# Patient Record
Sex: Male | Born: 1935 | Race: White | Hispanic: No | Smoking: Never smoker
Health system: Southern US, Community
[De-identification: ages and names within clinical notes are randomized; demographics above are authoritative.]

## PROBLEM LIST (undated history)

## (undated) DIAGNOSIS — I639 Cerebral infarction, unspecified: Secondary | ICD-10-CM

## (undated) DIAGNOSIS — I82409 Acute embolism and thrombosis of unspecified deep veins of unspecified lower extremity: Secondary | ICD-10-CM

## (undated) DIAGNOSIS — R410 Disorientation, unspecified: Secondary | ICD-10-CM

## (undated) DIAGNOSIS — I509 Heart failure, unspecified: Secondary | ICD-10-CM

## (undated) DIAGNOSIS — I10 Essential (primary) hypertension: Secondary | ICD-10-CM

## (undated) DIAGNOSIS — R339 Retention of urine, unspecified: Secondary | ICD-10-CM

## (undated) DIAGNOSIS — N400 Enlarged prostate without lower urinary tract symptoms: Secondary | ICD-10-CM

## (undated) DIAGNOSIS — R451 Restlessness and agitation: Secondary | ICD-10-CM

## (undated) HISTORY — PX: FOOT FRACTURE SURGERY: SHX645

## (undated) HISTORY — PX: OTHER SURGICAL HISTORY: SHX169

---

## 1997-06-24 ENCOUNTER — Other Ambulatory Visit: Admission: RE | Admit: 1997-06-24 | Discharge: 1997-06-24 | Payer: Self-pay | Admitting: Gastroenterology

## 1997-07-16 ENCOUNTER — Other Ambulatory Visit: Admission: RE | Admit: 1997-07-16 | Discharge: 1997-07-16 | Payer: Self-pay | Admitting: Gastroenterology

## 2003-01-11 ENCOUNTER — Emergency Department (HOSPITAL_COMMUNITY): Admission: EM | Admit: 2003-01-11 | Discharge: 2003-01-11 | Payer: Self-pay | Admitting: Internal Medicine

## 2003-06-27 ENCOUNTER — Ambulatory Visit (HOSPITAL_COMMUNITY): Admission: RE | Admit: 2003-06-27 | Discharge: 2003-06-27 | Payer: Self-pay | Admitting: Family Medicine

## 2003-06-30 ENCOUNTER — Emergency Department (HOSPITAL_COMMUNITY): Admission: EM | Admit: 2003-06-30 | Discharge: 2003-06-30 | Payer: Self-pay | Admitting: Emergency Medicine

## 2004-03-30 ENCOUNTER — Ambulatory Visit (HOSPITAL_COMMUNITY): Admission: RE | Admit: 2004-03-30 | Discharge: 2004-03-30 | Payer: Self-pay | Admitting: Family Medicine

## 2004-10-05 ENCOUNTER — Ambulatory Visit (HOSPITAL_COMMUNITY): Admission: RE | Admit: 2004-10-05 | Discharge: 2004-10-05 | Payer: Self-pay | Admitting: Family Medicine

## 2004-12-16 ENCOUNTER — Ambulatory Visit (HOSPITAL_COMMUNITY): Admission: RE | Admit: 2004-12-16 | Discharge: 2004-12-16 | Payer: Self-pay | Admitting: *Deleted

## 2004-12-22 ENCOUNTER — Ambulatory Visit (HOSPITAL_COMMUNITY): Admission: RE | Admit: 2004-12-22 | Discharge: 2004-12-22 | Payer: Self-pay | Admitting: Cardiology

## 2005-08-21 ENCOUNTER — Emergency Department (HOSPITAL_COMMUNITY): Admission: EM | Admit: 2005-08-21 | Discharge: 2005-08-21 | Payer: Self-pay | Admitting: Emergency Medicine

## 2006-08-21 ENCOUNTER — Ambulatory Visit (HOSPITAL_COMMUNITY): Admission: RE | Admit: 2006-08-21 | Discharge: 2006-08-21 | Payer: Self-pay | Admitting: Family Medicine

## 2009-04-20 ENCOUNTER — Ambulatory Visit (HOSPITAL_COMMUNITY): Admission: RE | Admit: 2009-04-20 | Discharge: 2009-04-20 | Payer: Self-pay | Admitting: Family Medicine

## 2009-07-31 ENCOUNTER — Encounter: Payer: Self-pay | Admitting: Emergency Medicine

## 2009-07-31 ENCOUNTER — Inpatient Hospital Stay (HOSPITAL_COMMUNITY): Admission: EM | Admit: 2009-07-31 | Discharge: 2009-08-05 | Payer: Self-pay | Admitting: Emergency Medicine

## 2009-08-04 ENCOUNTER — Encounter (INDEPENDENT_AMBULATORY_CARE_PROVIDER_SITE_OTHER): Payer: Self-pay | Admitting: Cardiovascular Disease

## 2009-08-05 ENCOUNTER — Inpatient Hospital Stay: Admission: AD | Admit: 2009-08-05 | Discharge: 2009-08-19 | Payer: Self-pay | Admitting: Internal Medicine

## 2009-08-19 ENCOUNTER — Ambulatory Visit: Payer: Self-pay | Admitting: Cardiology

## 2009-08-19 ENCOUNTER — Inpatient Hospital Stay (HOSPITAL_COMMUNITY): Admission: RE | Admit: 2009-08-19 | Discharge: 2009-09-04 | Payer: Self-pay | Admitting: Orthopedic Surgery

## 2009-09-04 ENCOUNTER — Inpatient Hospital Stay: Admission: AD | Admit: 2009-09-04 | Discharge: 2009-09-18 | Payer: Self-pay | Admitting: Internal Medicine

## 2009-09-18 ENCOUNTER — Inpatient Hospital Stay (HOSPITAL_COMMUNITY): Admission: EM | Admit: 2009-09-18 | Discharge: 2009-09-24 | Payer: Self-pay | Admitting: Emergency Medicine

## 2009-09-18 ENCOUNTER — Ambulatory Visit: Payer: Self-pay | Admitting: Cardiology

## 2009-09-24 ENCOUNTER — Inpatient Hospital Stay
Admission: AD | Admit: 2009-09-24 | Discharge: 2011-06-16 | Disposition: A | Discharge status: Other Acute Inpatient Hospital | Payer: Medicare Other | Source: Home / Self Care | Attending: Internal Medicine | Admitting: Internal Medicine

## 2009-11-28 ENCOUNTER — Ambulatory Visit (HOSPITAL_COMMUNITY): Admission: RE | Admit: 2009-11-28 | Discharge: 2009-11-28 | Payer: Self-pay | Admitting: Internal Medicine

## 2010-04-04 ENCOUNTER — Encounter: Payer: Self-pay | Admitting: Family Medicine

## 2010-04-04 ENCOUNTER — Encounter: Payer: Self-pay | Admitting: Orthopedic Surgery

## 2010-04-22 ENCOUNTER — Ambulatory Visit (INDEPENDENT_AMBULATORY_CARE_PROVIDER_SITE_OTHER): Payer: Medicare Other | Admitting: Otolaryngology

## 2010-04-22 DIAGNOSIS — J342 Deviated nasal septum: Secondary | ICD-10-CM

## 2010-04-22 DIAGNOSIS — R070 Pain in throat: Secondary | ICD-10-CM

## 2010-04-22 DIAGNOSIS — J310 Chronic rhinitis: Secondary | ICD-10-CM

## 2010-05-30 LAB — BASIC METABOLIC PANEL
BUN: 11 mg/dL (ref 6–23)
BUN: 12 mg/dL (ref 6–23)
BUN: 21 mg/dL (ref 6–23)
BUN: 10 mg/dL (ref 6–23)
BUN: 11 mg/dL (ref 6–23)
BUN: 12 mg/dL (ref 6–23)
BUN: 13 mg/dL (ref 6–23)
CO2: 25 mEq/L (ref 19–32)
CO2: 25 mEq/L (ref 19–32)
CO2: 28 mEq/L (ref 19–32)
CO2: 28 mEq/L (ref 19–32)
Calcium: 8.2 mg/dL — ABNORMAL LOW (ref 8.4–10.5)
Calcium: 8.4 mg/dL (ref 8.4–10.5)
Calcium: 8.4 mg/dL (ref 8.4–10.5)
Calcium: 8.2 mg/dL — ABNORMAL LOW (ref 8.4–10.5)
Chloride: 106 mEq/L (ref 96–112)
Chloride: 108 mEq/L (ref 96–112)
Chloride: 102 mEq/L (ref 96–112)
Chloride: 104 mEq/L (ref 96–112)
Chloride: 105 mEq/L (ref 96–112)
Chloride: 105 mEq/L (ref 96–112)
Creatinine, Ser: 1.03 mg/dL (ref 0.4–1.5)
Creatinine, Ser: 1.03 mg/dL (ref 0.4–1.5)
Creatinine, Ser: 1.15 mg/dL (ref 0.4–1.5)
Creatinine, Ser: 1.37 mg/dL (ref 0.4–1.5)
Creatinine, Ser: 1.05 mg/dL (ref 0.4–1.5)
Creatinine, Ser: 1.08 mg/dL (ref 0.4–1.5)
Creatinine, Ser: 1.14 mg/dL (ref 0.4–1.5)
Creatinine, Ser: 1.19 mg/dL (ref 0.4–1.5)
GFR calc Af Amer: >60 mL/min (ref >60)
GFR calc Af Amer: >60 mL/min (ref >60)
GFR calc Af Amer: >60 mL/min (ref >60)
GFR calc Af Amer: >60 mL/min (ref >60)
GFR calc Af Amer: >60 mL/min (ref >60)
GFR calc non Af Amer: 49 mL/min — ABNORMAL LOW (ref >60)
GFR calc non Af Amer: >60 mL/min (ref >60)
GFR calc non Af Amer: >60 mL/min (ref >60)
GFR calc non Af Amer: >60 mL/min (ref >60)
Glucose, Bld: 106 mg/dL — ABNORMAL HIGH (ref 70–99)
Glucose, Bld: 93 mg/dL (ref 70–99)
Glucose, Bld: 94 mg/dL (ref 70–99)
Glucose, Bld: 103 mg/dL — ABNORMAL HIGH (ref 70–99)
Glucose, Bld: 103 mg/dL — ABNORMAL HIGH (ref 70–99)
Potassium: 4.5 mEq/L (ref 3.5–5.1)
Potassium: 3.9 mEq/L (ref 3.5–5.1)
Sodium: 137 mEq/L (ref 135–145)

## 2010-05-30 LAB — CARDIAC PANEL(CRET KIN+CKTOT+MB+TROPI)
CK, MB: 0.6 ng/mL (ref 0.3–4.0)
CK, MB: 0.6 ng/mL (ref 0.3–4.0)
CK, MB: 1.5 ng/mL (ref 0.3–4.0)
Relative Index: INVALID (ref 0.0–2.5)
Relative Index: INVALID (ref 0.0–2.5)
Total CK: 12 U/L (ref 7–232)
Total CK: 13 U/L (ref 7–232)
Troponin I: 0.02 ng/mL (ref 0.00–0.06)

## 2010-05-30 LAB — CBC
HCT: 36.2 % — ABNORMAL LOW (ref 39.0–52.0)
HCT: 37.5 % — ABNORMAL LOW (ref 39.0–52.0)
HCT: 33.2 % — ABNORMAL LOW (ref 39.0–52.0)
HCT: 33.3 % — ABNORMAL LOW (ref 39.0–52.0)
HCT: 37.8 % — ABNORMAL LOW (ref 39.0–52.0)
Hemoglobin: 11.9 g/dL — ABNORMAL LOW (ref 13.0–17.0)
MCH: 30.6 pg (ref 26.0–34.0)
MCH: 30.6 pg (ref 26.0–34.0)
MCH: 30.7 pg (ref 26.0–34.0)
MCHC: 33 g/dL (ref 30.0–36.0)
MCHC: 33 g/dL (ref 30.0–36.0)
MCHC: 33.2 g/dL (ref 30.0–36.0)
MCHC: 33.4 g/dL (ref 30.0–36.0)
MCHC: 33.5 g/dL (ref 30.0–36.0)
MCV: 92.8 fL (ref 78.0–100.0)
MCV: 92.8 fL (ref 78.0–100.0)
MCV: 93.4 fL (ref 78.0–100.0)
MCV: 93.4 fL (ref 78.0–100.0)
MCV: 94.4 fL (ref 78.0–100.0)
MCV: 94.7 fL (ref 78.0–100.0)
MCV: 95.5 fL (ref 78.0–100.0)
Platelets: 178 10*3/uL (ref 150–400)
Platelets: 188 10*3/uL (ref 150–400)
Platelets: 194 10*3/uL (ref 150–400)
Platelets: 201 10*3/uL (ref 150–400)
Platelets: 251 10*3/uL (ref 150–400)
Platelets: 285 10*3/uL (ref 150–400)
RBC: 3.87 MIL/uL — ABNORMAL LOW (ref 4.22–5.81)
RBC: 3.89 MIL/uL — ABNORMAL LOW (ref 4.22–5.81)
RBC: 4.04 MIL/uL — ABNORMAL LOW (ref 4.22–5.81)
RBC: 4.05 MIL/uL — ABNORMAL LOW (ref 4.22–5.81)
RBC: 3.49 MIL/uL — ABNORMAL LOW (ref 4.22–5.81)
RBC: 3.5 MIL/uL — ABNORMAL LOW (ref 4.22–5.81)
RBC: 3.71 MIL/uL — ABNORMAL LOW (ref 4.22–5.81)
RDW: 15.9 % — ABNORMAL HIGH (ref 11.5–15.5)
RDW: 16.5 % — ABNORMAL HIGH (ref 11.5–15.5)
RDW: 16.5 % — ABNORMAL HIGH (ref 11.5–15.5)
RDW: 16.5 % — ABNORMAL HIGH (ref 11.5–15.5)
WBC: 7.3 10*3/uL (ref 4.0–10.5)
WBC: 6.1 10*3/uL (ref 4.0–10.5)
WBC: 6.4 10*3/uL (ref 4.0–10.5)
WBC: 7.4 10*3/uL (ref 4.0–10.5)

## 2010-05-30 LAB — DIFFERENTIAL
Basophils Absolute: 0 10*3/uL (ref 0.0–0.1)
Basophils Absolute: 0 10*3/uL (ref 0.0–0.1)
Basophils Relative: 0 % (ref 0–1)
Basophils Relative: 0 % (ref 0–1)
Basophils Relative: 1 % (ref 0–1)
Eosinophils Absolute: 0.7 10*3/uL (ref 0.0–0.7)
Eosinophils Absolute: 0.7 10*3/uL (ref 0.0–0.7)
Eosinophils Absolute: 0.8 10*3/uL — ABNORMAL HIGH (ref 0.0–0.7)
Eosinophils Relative: 10 % — ABNORMAL HIGH (ref 0–5)
Eosinophils Relative: 7 % — ABNORMAL HIGH (ref 0–5)
Eosinophils Relative: 9 % — ABNORMAL HIGH (ref 0–5)
Lymphocytes Relative: 31 % (ref 12–46)
Lymphs Abs: 2.2 10*3/uL (ref 0.7–4.0)
Lymphs Abs: 2.2 10*3/uL (ref 0.7–4.0)
Lymphs Abs: 2.2 10*3/uL (ref 0.7–4.0)
Monocytes Absolute: 0.5 10*3/uL (ref 0.1–1.0)
Monocytes Absolute: 0.5 10*3/uL (ref 0.1–1.0)
Monocytes Relative: 7 % (ref 3–12)
Neutro Abs: 3.7 10*3/uL (ref 1.7–7.7)
Neutro Abs: 5.4 10*3/uL (ref 1.7–7.7)
Neutrophils Relative %: 53 % (ref 43–77)
Neutrophils Relative %: 64 % (ref 43–77)

## 2010-05-30 LAB — VANCOMYCIN, TROUGH: Vancomycin Tr: 13 ug/mL (ref 10.0–20.0)

## 2010-05-30 LAB — POCT CARDIAC MARKERS
CKMB, poc: <1 ng/mL — ABNORMAL LOW (ref 1.0–8.0)
Myoglobin, poc: 65 ng/mL (ref 12–200)

## 2010-05-30 LAB — URINALYSIS, ROUTINE W REFLEX MICROSCOPIC
Bilirubin Urine: NEGATIVE
Protein, ur: NEGATIVE mg/dL
Urobilinogen, UA: 0.2 mg/dL (ref 0.0–1.0)

## 2010-05-30 LAB — D-DIMER, QUANTITATIVE: D-Dimer, Quant: 3.77 ug/mL-FEU — ABNORMAL HIGH (ref 0.00–0.48)

## 2010-05-30 LAB — BRAIN NATRIURETIC PEPTIDE
Pro B Natriuretic peptide (BNP): 252 pg/mL — ABNORMAL HIGH (ref 0.0–100.0)
Pro B Natriuretic peptide (BNP): 293 pg/mL — ABNORMAL HIGH (ref 0.0–100.0)
Pro B Natriuretic peptide (BNP): 500 pg/mL — ABNORMAL HIGH (ref 0.0–100.0)
Pro B Natriuretic peptide (BNP): 505 pg/mL — ABNORMAL HIGH (ref 0.0–100.0)

## 2010-05-30 LAB — CORTISOL-AM, BLOOD: Cortisol - AM: 8.1 ug/dL (ref 4.3–22.4)

## 2010-05-31 LAB — DIFFERENTIAL
Basophils Absolute: 0 10*3/uL (ref 0.0–0.1)
Basophils Absolute: 0.1 10*3/uL (ref 0.0–0.1)
Basophils Absolute: 0 10*3/uL (ref 0.0–0.1)
Basophils Relative: 0 % (ref 0–1)
Basophils Relative: 0 % (ref 0–1)
Eosinophils Absolute: 0.1 10*3/uL (ref 0.0–0.7)
Eosinophils Absolute: 0 10*3/uL (ref 0.0–0.7)
Eosinophils Relative: 0 % (ref 0–5)
Eosinophils Relative: 1 % (ref 0–5)
Eosinophils Relative: 0 % (ref 0–5)
Lymphocytes Relative: 16 % (ref 12–46)
Lymphocytes Relative: 9 % — ABNORMAL LOW (ref 12–46)
Lymphocytes Relative: 29 % (ref 12–46)
Lymphs Abs: 1.4 10*3/uL (ref 0.7–4.0)
Monocytes Absolute: 0.5 10*3/uL (ref 0.1–1.0)
Monocytes Absolute: 0.7 10*3/uL (ref 0.1–1.0)
Monocytes Absolute: 0.4 10*3/uL (ref 0.1–1.0)
Neutro Abs: 5 10*3/uL (ref 1.7–7.7)

## 2010-05-31 LAB — BASIC METABOLIC PANEL
BUN: 14 mg/dL (ref 6–23)
BUN: 18 mg/dL (ref 6–23)
BUN: 18 mg/dL (ref 6–23)
BUN: 19 mg/dL (ref 6–23)
CO2: 22 mEq/L (ref 19–32)
CO2: 23 mEq/L (ref 19–32)
CO2: 23 mEq/L (ref 19–32)
CO2: 18 mEq/L — ABNORMAL LOW (ref 19–32)
CO2: 21 mEq/L (ref 19–32)
Calcium: 7.8 mg/dL — ABNORMAL LOW (ref 8.4–10.5)
Calcium: 8 mg/dL — ABNORMAL LOW (ref 8.4–10.5)
Chloride: 105 mEq/L (ref 96–112)
Chloride: 107 mEq/L (ref 96–112)
Chloride: 108 mEq/L (ref 96–112)
Chloride: 109 mEq/L (ref 96–112)
Chloride: 110 mEq/L (ref 96–112)
Chloride: 115 mEq/L — ABNORMAL HIGH (ref 96–112)
Creatinine, Ser: 1.1 mg/dL (ref 0.4–1.5)
Creatinine, Ser: 1.16 mg/dL (ref 0.4–1.5)
Creatinine, Ser: 1.21 mg/dL (ref 0.4–1.5)
Creatinine, Ser: 1.21 mg/dL (ref 0.4–1.5)
Creatinine, Ser: 1.1 mg/dL (ref 0.4–1.5)
Creatinine, Ser: 1.14 mg/dL (ref 0.4–1.5)
GFR calc Af Amer: >60 mL/min (ref >60)
GFR calc Af Amer: >60 mL/min (ref >60)
GFR calc Af Amer: >60 mL/min (ref >60)
GFR calc Af Amer: >60 mL/min (ref >60)
GFR calc Af Amer: >60 mL/min (ref >60)
GFR calc non Af Amer: 56 mL/min — ABNORMAL LOW (ref >60)
GFR calc non Af Amer: >60 mL/min (ref >60)
GFR calc non Af Amer: 50 mL/min — ABNORMAL LOW (ref >60)
GFR calc non Af Amer: 53 mL/min — ABNORMAL LOW (ref >60)
GFR calc non Af Amer: >60 mL/min (ref >60)
Glucose, Bld: 103 mg/dL — ABNORMAL HIGH (ref 70–99)
Glucose, Bld: 150 mg/dL — ABNORMAL HIGH (ref 70–99)
Glucose, Bld: 163 mg/dL — ABNORMAL HIGH (ref 70–99)
Potassium: 4.3 mEq/L (ref 3.5–5.1)
Potassium: 4.4 mEq/L (ref 3.5–5.1)
Potassium: 3.7 mEq/L (ref 3.5–5.1)
Potassium: 3.9 mEq/L (ref 3.5–5.1)
Potassium: 4.3 mEq/L (ref 3.5–5.1)
Potassium: 4.4 mEq/L (ref 3.5–5.1)
Sodium: 134 mEq/L — ABNORMAL LOW (ref 135–145)
Sodium: 137 mEq/L (ref 135–145)

## 2010-05-31 LAB — TYPE AND SCREEN: ABO/RH(D): O POS

## 2010-05-31 LAB — URINE CULTURE: Culture: NO GROWTH

## 2010-05-31 LAB — CBC
HCT: 26.8 % — ABNORMAL LOW (ref 39.0–52.0)
HCT: 28.2 % — ABNORMAL LOW (ref 39.0–52.0)
HCT: 31.5 % — ABNORMAL LOW (ref 39.0–52.0)
HCT: 30.3 % — ABNORMAL LOW (ref 39.0–52.0)
HCT: 27.1 % — ABNORMAL LOW (ref 39.0–52.0)
HCT: 28.9 % — ABNORMAL LOW (ref 39.0–52.0)
HCT: 32.5 % — ABNORMAL LOW (ref 39.0–52.0)
HCT: 35.7 % — ABNORMAL LOW (ref 39.0–52.0)
Hemoglobin: 10.4 g/dL — ABNORMAL LOW (ref 13.0–17.0)
Hemoglobin: 9.8 g/dL — ABNORMAL LOW (ref 13.0–17.0)
Hemoglobin: 10.4 g/dL — ABNORMAL LOW (ref 13.0–17.0)
Hemoglobin: 9.6 g/dL — ABNORMAL LOW (ref 13.0–17.0)
MCHC: 33.2 g/dL (ref 30.0–36.0)
MCHC: 33.5 g/dL (ref 30.0–36.0)
MCHC: 34.7 g/dL (ref 30.0–36.0)
MCHC: 34.2 g/dL (ref 30.0–36.0)
MCHC: 35 g/dL (ref 30.0–36.0)
MCV: 95 fL (ref 78.0–100.0)
MCV: 95.4 fL (ref 78.0–100.0)
MCV: 96.2 fL (ref 78.0–100.0)
MCV: 91.6 fL (ref 78.0–100.0)
MCV: 91.8 fL (ref 78.0–100.0)
MCV: 91.9 fL (ref 78.0–100.0)
MCV: 92.1 fL (ref 78.0–100.0)
MCV: 92.4 fL (ref 78.0–100.0)
Platelets: 268 10*3/uL (ref 150–400)
Platelets: 349 10*3/uL (ref 150–400)
Platelets: 199 10*3/uL (ref 150–400)
Platelets: 208 10*3/uL (ref 150–400)
Platelets: 122 10*3/uL — ABNORMAL LOW (ref 150–400)
Platelets: 78 10*3/uL — ABNORMAL LOW (ref 150–400)
Platelets: 90 10*3/uL — ABNORMAL LOW (ref 150–400)
Platelets: 91 10*3/uL — ABNORMAL LOW (ref 150–400)
RBC: 2.81 MIL/uL — ABNORMAL LOW (ref 4.22–5.81)
RBC: 2.93 MIL/uL — ABNORMAL LOW (ref 4.22–5.81)
RBC: 2.98 MIL/uL — ABNORMAL LOW (ref 4.22–5.81)
RBC: 3.13 MIL/uL — ABNORMAL LOW (ref 4.22–5.81)
RBC: 3.28 MIL/uL — ABNORMAL LOW (ref 4.22–5.81)
RBC: 4.35 MIL/uL (ref 4.22–5.81)
RBC: 2.9 MIL/uL — ABNORMAL LOW (ref 4.22–5.81)
RBC: 2.94 MIL/uL — ABNORMAL LOW (ref 4.22–5.81)
RDW: 16.9 % — ABNORMAL HIGH (ref 11.5–15.5)
RDW: 17.5 % — ABNORMAL HIGH (ref 11.5–15.5)
RDW: 14.3 % (ref 11.5–15.5)
RDW: 15 % (ref 11.5–15.5)
WBC: 6.7 10*3/uL (ref 4.0–10.5)
WBC: 7.3 10*3/uL (ref 4.0–10.5)
WBC: 7.4 10*3/uL (ref 4.0–10.5)
WBC: 15.6 10*3/uL — ABNORMAL HIGH (ref 4.0–10.5)
WBC: 18.1 10*3/uL — ABNORMAL HIGH (ref 4.0–10.5)
WBC: 10.9 10*3/uL — ABNORMAL HIGH (ref 4.0–10.5)
WBC: 7.9 10*3/uL (ref 4.0–10.5)
WBC: 8.4 10*3/uL (ref 4.0–10.5)

## 2010-05-31 LAB — URINALYSIS, ROUTINE W REFLEX MICROSCOPIC
Bilirubin Urine: NEGATIVE
Glucose, UA: NEGATIVE mg/dL
Hgb urine dipstick: NEGATIVE
Ketones, ur: NEGATIVE mg/dL
Nitrite: NEGATIVE
Protein, ur: NEGATIVE mg/dL
Specific Gravity, Urine: 1.01 (ref 1.005–1.030)
Specific Gravity, Urine: 1.02 (ref 1.005–1.030)
Urobilinogen, UA: 0.2 mg/dL (ref 0.0–1.0)
Urobilinogen, UA: 0.2 mg/dL (ref 0.0–1.0)
pH: 5 (ref 5.0–8.0)
pH: 7.5 (ref 5.0–8.0)

## 2010-05-31 LAB — POCT I-STAT 7, (LYTES, BLD GAS, ICA,H+H)
Acid-base deficit: 7 mmol/L — ABNORMAL HIGH (ref 0.0–2.0)
Bicarbonate: 20.2 mEq/L (ref 20.0–24.0)
Calcium, Ion: 0.86 mmol/L — ABNORMAL LOW (ref 1.12–1.32)
HCT: 27 % — ABNORMAL LOW (ref 39.0–52.0)
O2 Saturation: 100 %
Patient temperature: 35.5
Patient temperature: 35.8
Potassium: 5.1 mEq/L (ref 3.5–5.1)
Sodium: 143 mEq/L (ref 135–145)
pCO2 arterial: 38.3 mmHg (ref 35.0–45.0)
pCO2 arterial: 43.9 mmHg (ref 35.0–45.0)
pH, Arterial: 7.362 (ref 7.350–7.450)
pO2, Arterial: 496 mmHg — ABNORMAL HIGH (ref 80.0–100.0)

## 2010-05-31 LAB — COMPREHENSIVE METABOLIC PANEL
ALT: 13 U/L (ref 0–53)
AST: 20 U/L (ref 0–37)
Albumin: 2.5 g/dL — ABNORMAL LOW (ref 3.5–5.2)
Albumin: 3.2 g/dL — ABNORMAL LOW (ref 3.5–5.2)
Alkaline Phosphatase: 115 U/L (ref 39–117)
Alkaline Phosphatase: 39 U/L (ref 39–117)
BUN: 15 mg/dL (ref 6–23)
Chloride: 108 mEq/L (ref 96–112)
Chloride: 108 mEq/L (ref 96–112)
GFR calc Af Amer: >60 mL/min (ref >60)
Glucose, Bld: 118 mg/dL — ABNORMAL HIGH (ref 70–99)
Potassium: 4.2 mEq/L (ref 3.5–5.1)
Potassium: 4 mEq/L (ref 3.5–5.1)
Sodium: 140 mEq/L (ref 135–145)
Total Bilirubin: 1.5 mg/dL — ABNORMAL HIGH (ref 0.3–1.2)
Total Bilirubin: 1.4 mg/dL — ABNORMAL HIGH (ref 0.3–1.2)

## 2010-05-31 LAB — CARDIAC PANEL(CRET KIN+CKTOT+MB+TROPI)
Relative Index: 1.6 (ref 0.0–2.5)
Total CK: 146 U/L (ref 7–232)
Troponin I: 0.02 ng/mL (ref 0.00–0.06)

## 2010-05-31 LAB — ANAEROBIC CULTURE

## 2010-05-31 LAB — URINE MICROSCOPIC-ADD ON

## 2010-05-31 LAB — GRAM STAIN

## 2010-05-31 LAB — CALCIUM, IONIZED: Calcium, Ion: 1.05 mmol/L — ABNORMAL LOW (ref 1.12–1.32)

## 2010-05-31 LAB — ABO/RH: ABO/RH(D): O POS

## 2010-05-31 LAB — PREPARE FRESH FROZEN PLASMA

## 2010-05-31 LAB — APTT: aPTT: 36 seconds (ref 24–37)

## 2010-05-31 LAB — BRAIN NATRIURETIC PEPTIDE: Pro B Natriuretic peptide (BNP): 575 pg/mL — ABNORMAL HIGH (ref 0.0–100.0)

## 2010-05-31 LAB — WOUND CULTURE: Culture: NO GROWTH

## 2010-05-31 LAB — SAMPLE TO BLOOD BANK

## 2010-05-31 LAB — PROTIME-INR: Prothrombin Time: 16.5 seconds — ABNORMAL HIGH (ref 11.6–15.2)

## 2010-07-30 NOTE — Cardiovascular Report (Signed)
Anthony Leon, Anthony Leon               ACCOUNT NO.:  [id number]   MEDICAL RECORD NO.:  [id number]          PATIENT TYPE:  OIB   LOCATION:  2899                         FACILITY:  [facility]   PHYSICIAN:  Madaline Savage, M.D.DATE OF BIRTH:  Nov 06, 1918   DATE OF PROCEDURE:  12/22/2004  DATE OF DISCHARGE:                              CARDIAC CATHETERIZATION   PROCEDURES PERFORMED:  Outpatient cardiac catheterization consisting of  retrograde left heart catheterization, left ventricular angiography, and  selective coronary angiography by Judkins technique.   COMPLICATIONS:  None.   ENTRY SITE:  Right femoral.   DYE USED:  Omnipaque.   CATHETERS USED:  6-French Cordis diagnostic catheters.   MEDICATIONS GIVEN:  Fentanyl 25 mg IV for sedation and labetalol 20 mg IV  for elevated blood pressure.   COMPLICATIONS:  None.   PATIENT PROFILE:  The patient is a delightful 77 year old gentleman with a  history of recent CVA with an MRI of the brain on October 05, 2004 showing a  right occipital lobe infarct that was felt to be acute.  Carotid ultrasound  showed no evidence of any significant flow reduction.  The patient did have  a Cardiolite stress test on November 24, 2004 with apical thinning and mild  anterolateral wall ischemia.  This was a new finding since 2001 when the  same study was negative.  His ejection fraction was considered 55% by  echocardiogram.  No embolic source was found.  The patient was apprised of  the results and his younger sister and the patient were advised of the  Cardiolite results and thought about it as an outpatient and then called our  office back to give informed consent regarding going ahead with an  outpatient cardiac catheterization.  That was performed today at [facility] without any complications.   RESULTS:   PRESSURES:  Left ventricular pressure was 165/12, end-diastolic pressure 23.  Central aortic pressure 165/75, mean of 115.  No aortic  valve gradient by  pullback technique.   ANGIOGRAPHIC RESULTS:  The left main coronary artery was short in length,  large in diameter, and showed no significant lesions.  There was very faint  calcification in the proximal LAD.  The LAD itself was fairly small and gave  rise to two diagonal branches, the first diagonal branch arising before  septal perforator branch #1.  The entirety of the two diagonals and LAD and  septal perforator branches all showed a normal, though small vessel.   The left circumflex was a very large and dominant vessel in the circulation  giving rise to one obtuse marginal branch and a large dominant posterior  descending and a fairly large sized posterolateral branch.  No lesions were  seen in the entirety of circumflex system.   The right coronary artery was a small nondominant vessel giving rise to a  sinus node and pulmonary conus branch and terminating before the acute angle  of the heart.  No lesions were seen in the RCA.   The left ventricular angiogram showed normal contractility of all wall  segments and I estimate ejection fraction  at 60%.  No evidence of mitral  regurgitation or LV thrombus was seen.   FINAL DIAGNOSIS:  1.  Falsely positive Cardiolite stress test for ischemia.  2.  Angiographically patent coronary arteries.  3.  Left circumflex dominant system.  4.  Normal left ventricular systolic function, ejection fraction 60%.   PLAN:  The patient is reassured.  He will be continued on Plavix, aspirin,  and Lipitor for stroke reduction.  He will be followed in the [place]  office of [facility] [place] and by his  primary care physician, Dr. Patrica Duel.           ______________________________  Madaline Savage, M.D.     WHG/MEDQ  D:  12/22/2004  T:  12/22/2004  Job:  [id number]   cc:   Patrica Duel, M.D.  Fax: [phone]   [facility], [place]

## 2011-04-02 IMAGING — CR DG FOOT COMPLETE 3+V*L*
3 series · 3 of 3 positions shown · non-contrast
Comparison: 08/20/2009

CLINICAL DATA: Multiple fractures.

LEFT FOOT - COMPLETE 3+ VIEW

[view not recorded (1 of 3)]
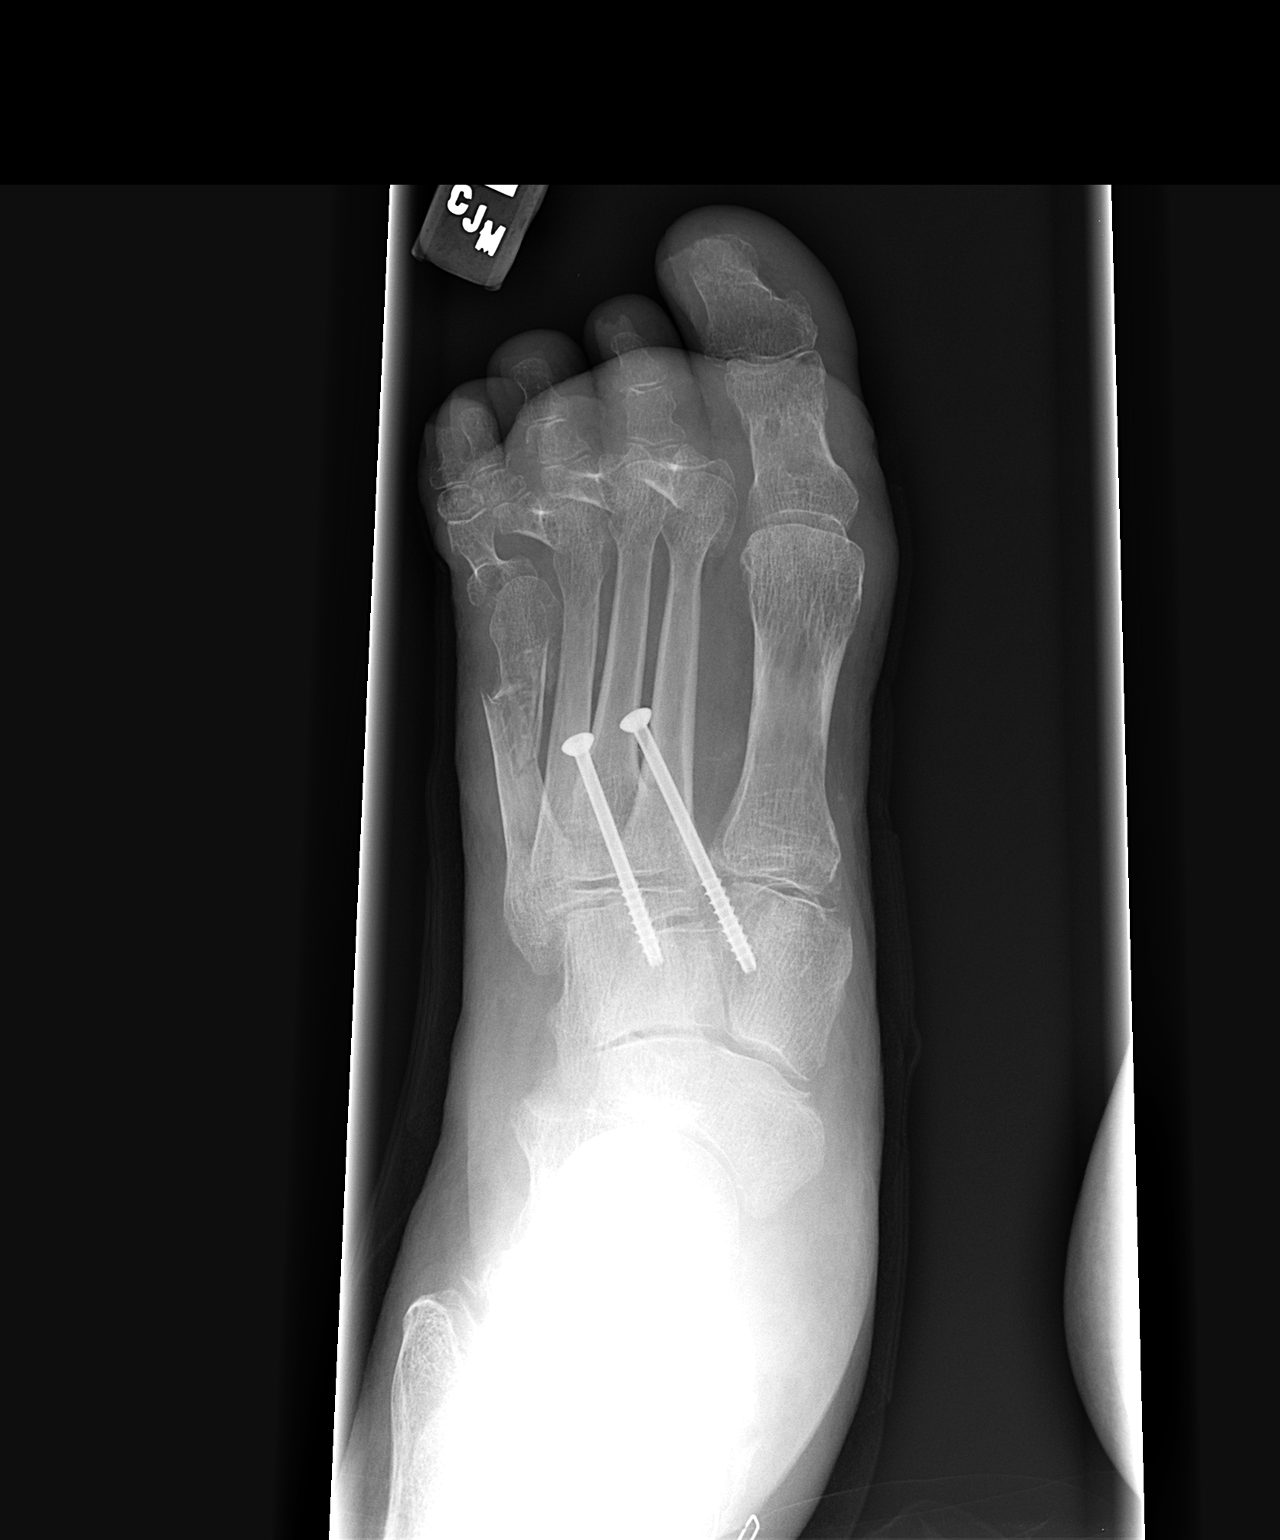

[view not recorded (2 of 3)]
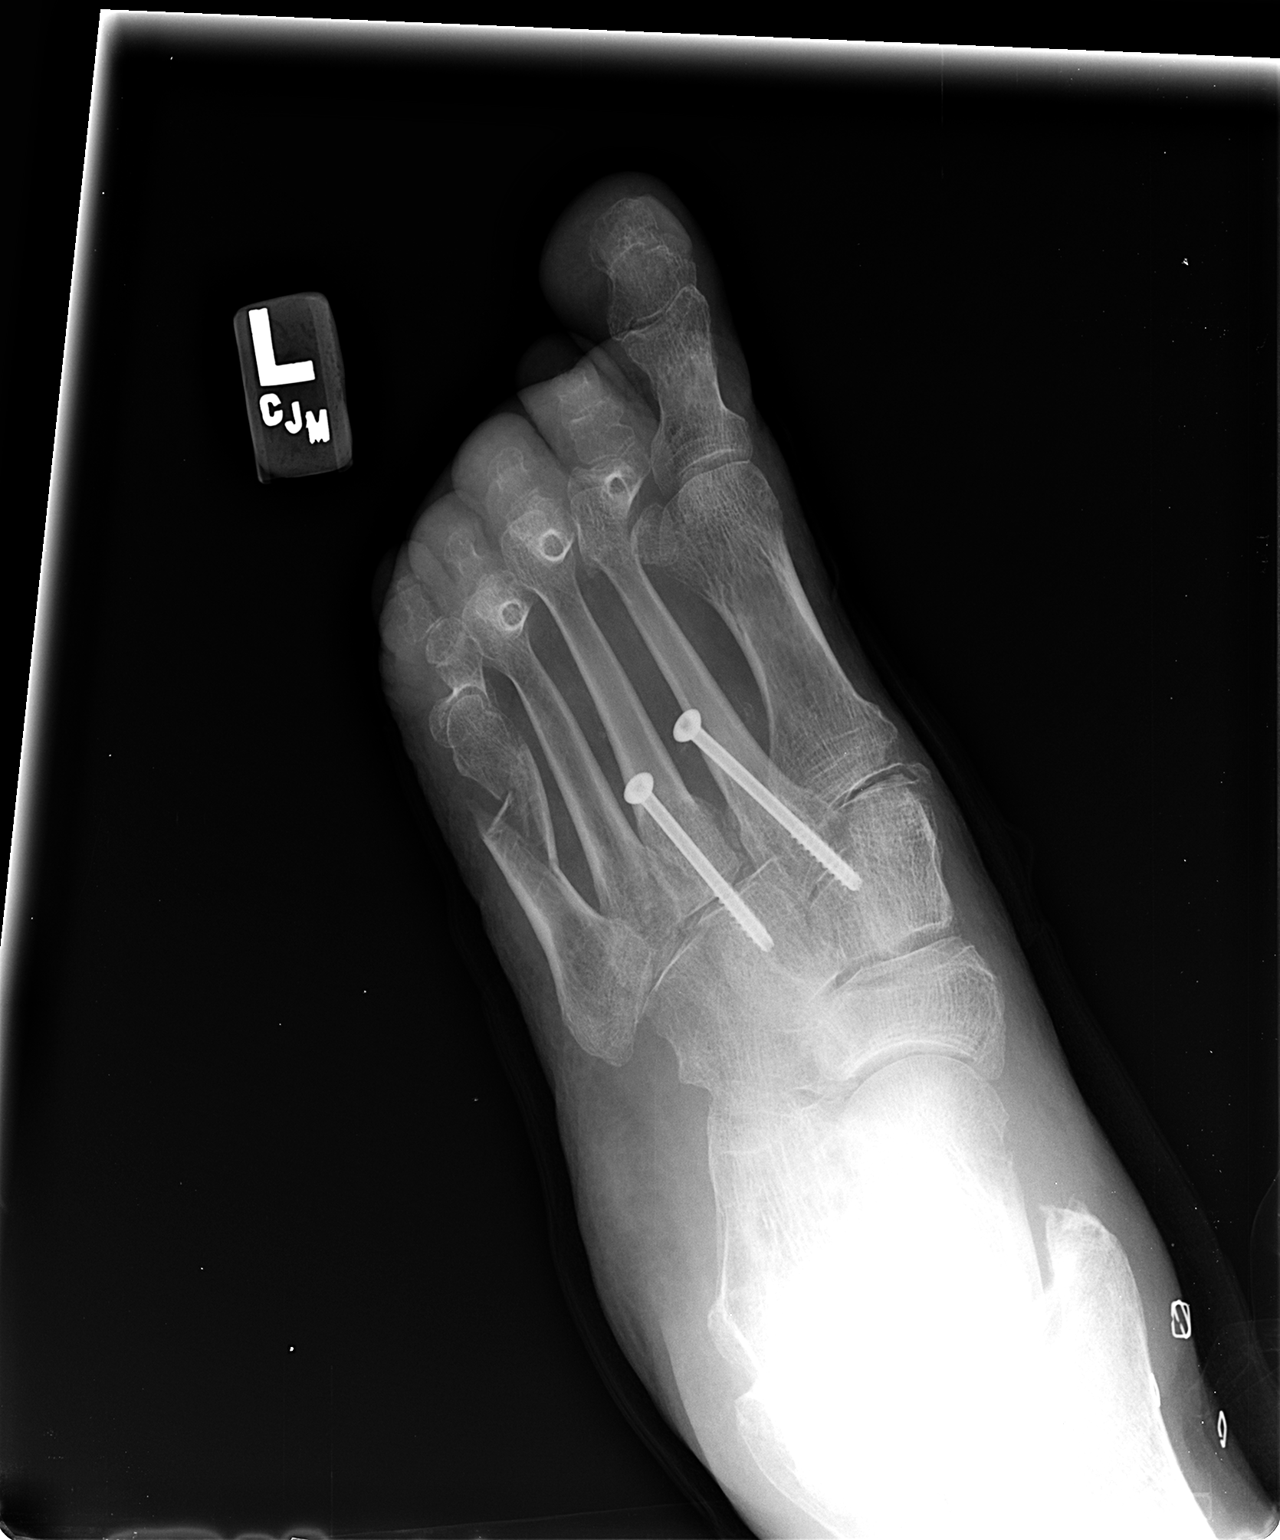

[view not recorded (3 of 3)]
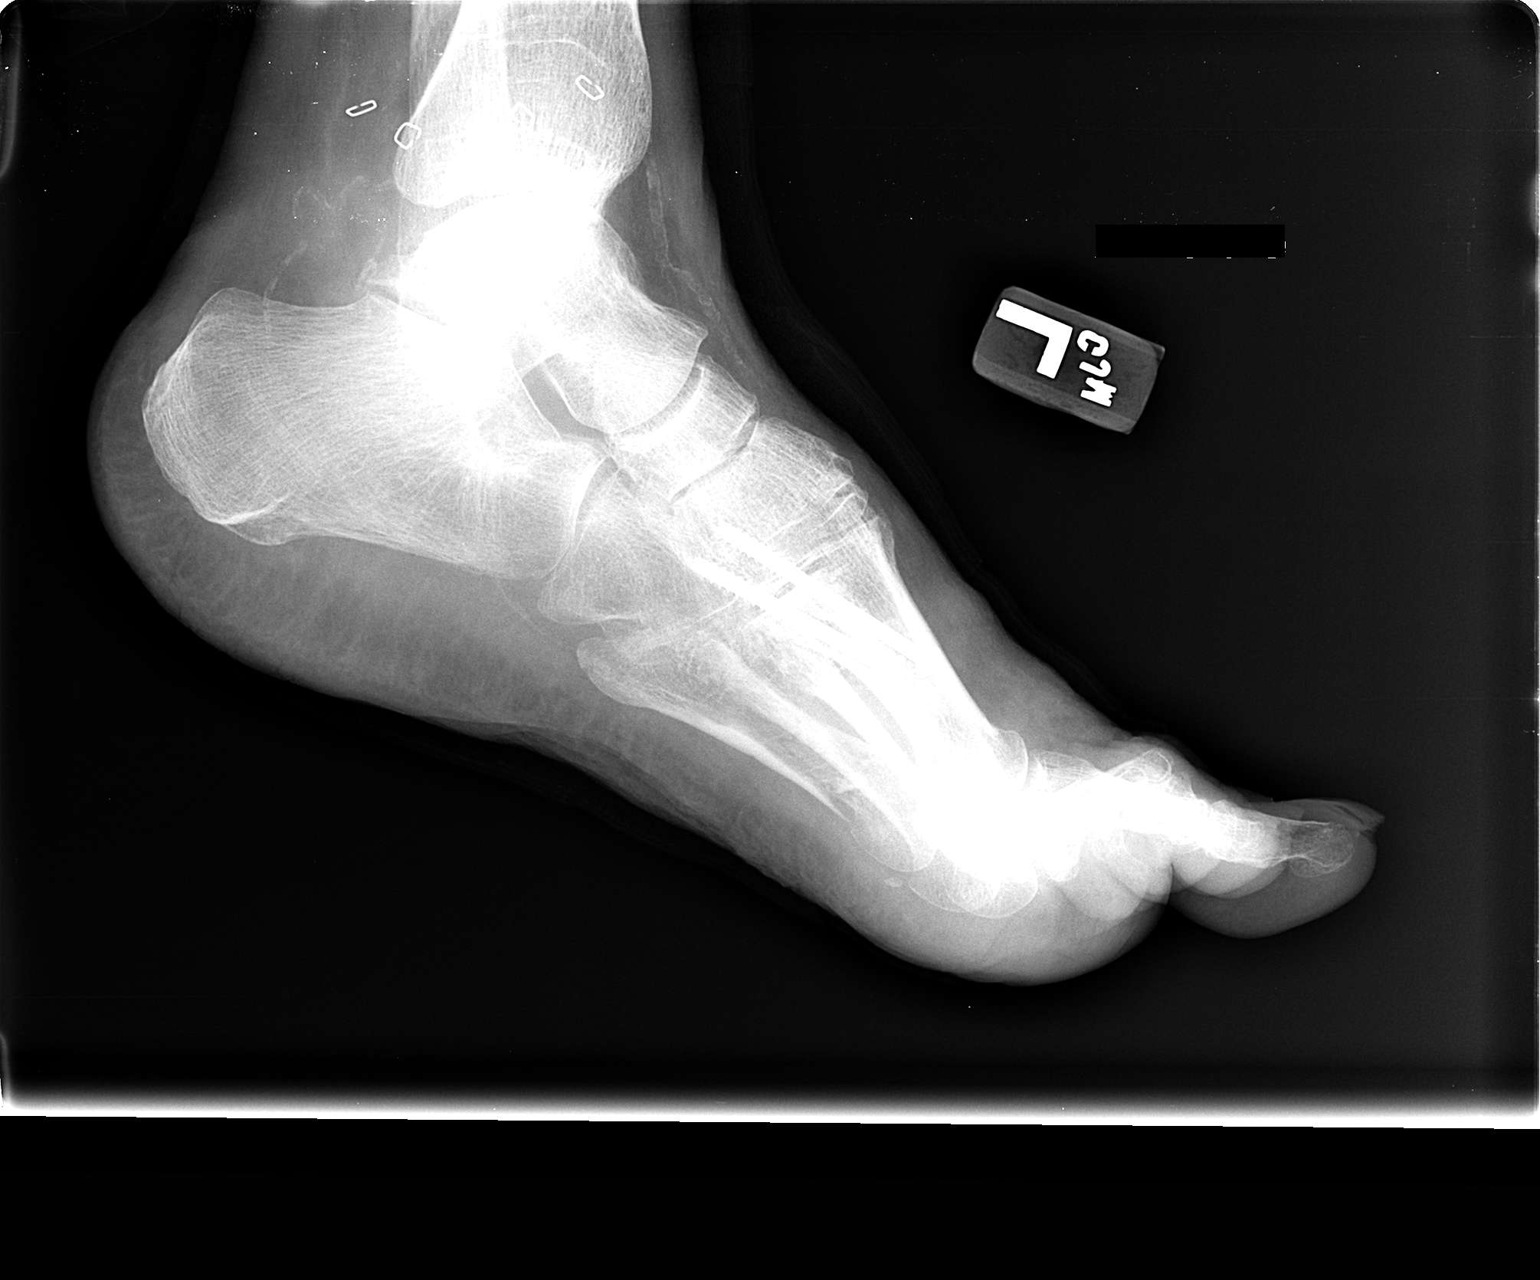

[3 of 3 positions shown; findings below may reference images not displayed]

FINDINGS: Cannulated compression screws cross the second and third
tarsometatarsal joints.  A comminuted fracture of the mid fifth
metatarsal again noted.  No interval change and bony alignment.  No
hardware complications.
IMPRESSION: Stable exam.  Fracture lines of the fifth metatarsal fracture are
still fairly well preserved and there is no evidence for bridging
bony callus.

## 2011-04-02 IMAGING — CR DG KNEE 1-2V*R*
1 series · 1 of 1 positions shown · non-contrast
Comparison: 08/20/2009

CLINICAL DATA: Fracture

RIGHT KNEE - 1-2 VIEW

[view not recorded]
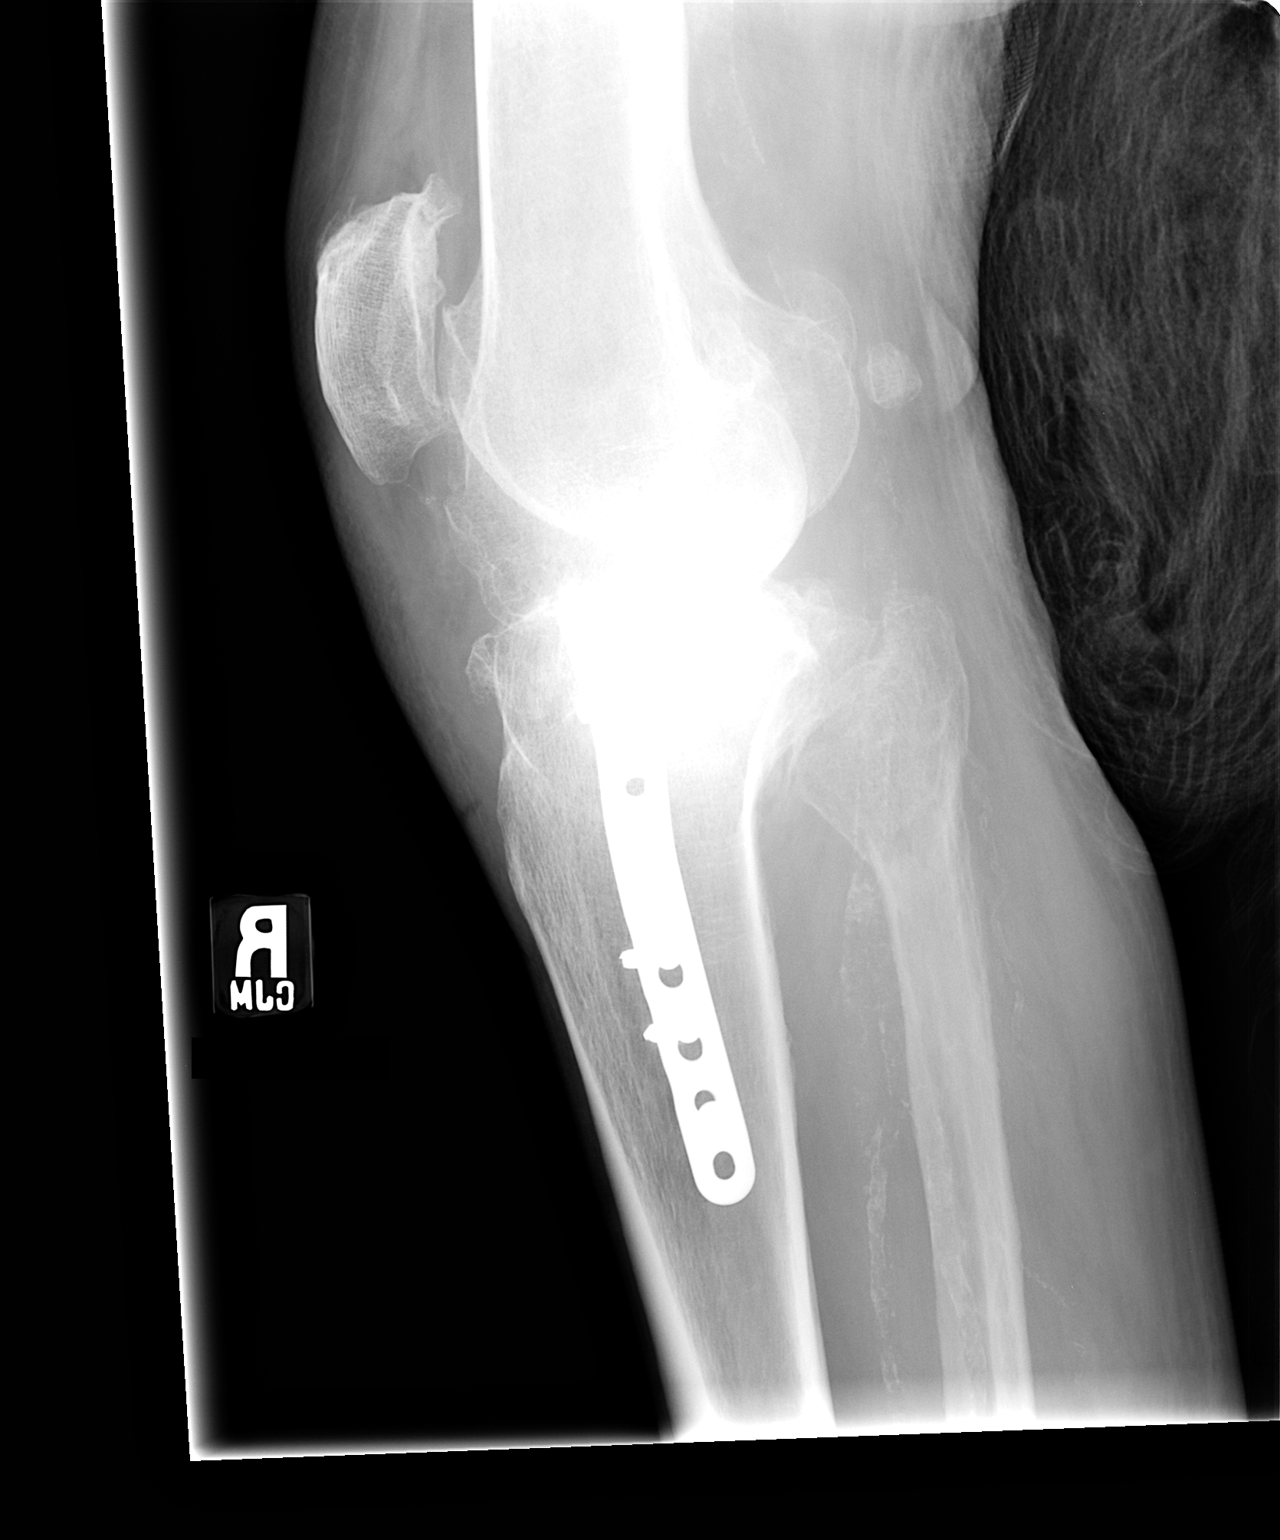

[1 of 1 positions shown; findings below may reference images not displayed]

FINDINGS: Metal plate screw fixation for tibial plateau fracture.
Proximal fibula fracture again noted.  No change and hardware
position.  Bony alignment is stable.  Tricompartmental degenerative
changes are seen in the knee.  No evidence for joint effusion.
IMPRESSION: Stable exam.

## 2011-06-14 ENCOUNTER — Ambulatory Visit (HOSPITAL_COMMUNITY)
Admit: 2011-06-14 | Discharge: 2011-06-14 | Disposition: A | Discharge status: Home / Self Care | Payer: Medicare Other | Attending: Internal Medicine | Admitting: Internal Medicine

## 2011-06-14 DIAGNOSIS — R05 Cough: Secondary | ICD-10-CM | POA: Insufficient documentation

## 2011-06-14 DIAGNOSIS — R091 Pleurisy: Secondary | ICD-10-CM | POA: Insufficient documentation

## 2011-06-14 DIAGNOSIS — R059 Cough, unspecified: Secondary | ICD-10-CM | POA: Insufficient documentation

## 2011-06-14 DIAGNOSIS — R509 Fever, unspecified: Secondary | ICD-10-CM | POA: Insufficient documentation

## 2011-06-16 ENCOUNTER — Encounter (HOSPITAL_COMMUNITY): Payer: Self-pay | Admitting: Emergency Medicine

## 2011-06-16 ENCOUNTER — Inpatient Hospital Stay (HOSPITAL_COMMUNITY)
Admission: EM | Admit: 2011-06-16 | Discharge: 2011-06-22 | DRG: 193 | Disposition: A | Discharge status: Skilled Nursing Facility | Payer: Medicare Other | Attending: Internal Medicine | Admitting: Internal Medicine

## 2011-06-16 ENCOUNTER — Other Ambulatory Visit: Payer: Self-pay

## 2011-06-16 ENCOUNTER — Emergency Department (HOSPITAL_COMMUNITY): Payer: Medicare Other

## 2011-06-16 DIAGNOSIS — F05 Delirium due to known physiological condition: Secondary | ICD-10-CM | POA: Diagnosis present

## 2011-06-16 DIAGNOSIS — J101 Influenza due to other identified influenza virus with other respiratory manifestations: Secondary | ICD-10-CM

## 2011-06-16 DIAGNOSIS — R197 Diarrhea, unspecified: Secondary | ICD-10-CM | POA: Diagnosis present

## 2011-06-16 DIAGNOSIS — R4182 Altered mental status, unspecified: Secondary | ICD-10-CM

## 2011-06-16 DIAGNOSIS — R0902 Hypoxemia: Secondary | ICD-10-CM

## 2011-06-16 DIAGNOSIS — R091 Pleurisy: Secondary | ICD-10-CM | POA: Diagnosis present

## 2011-06-16 DIAGNOSIS — D72829 Elevated white blood cell count, unspecified: Secondary | ICD-10-CM | POA: Diagnosis present

## 2011-06-16 DIAGNOSIS — N400 Enlarged prostate without lower urinary tract symptoms: Secondary | ICD-10-CM | POA: Diagnosis present

## 2011-06-16 DIAGNOSIS — H409 Unspecified glaucoma: Secondary | ICD-10-CM | POA: Diagnosis present

## 2011-06-16 DIAGNOSIS — I5033 Acute on chronic diastolic (congestive) heart failure: Secondary | ICD-10-CM | POA: Diagnosis not present

## 2011-06-16 DIAGNOSIS — J1100 Influenza due to unidentified influenza virus with unspecified type of pneumonia: Principal | ICD-10-CM | POA: Diagnosis present

## 2011-06-16 DIAGNOSIS — R509 Fever, unspecified: Secondary | ICD-10-CM

## 2011-06-16 DIAGNOSIS — Z880 Allergy status to penicillin: Secondary | ICD-10-CM

## 2011-06-16 DIAGNOSIS — I509 Heart failure, unspecified: Secondary | ICD-10-CM | POA: Diagnosis present

## 2011-06-16 DIAGNOSIS — E872 Acidosis, unspecified: Secondary | ICD-10-CM | POA: Diagnosis not present

## 2011-06-16 DIAGNOSIS — I479 Paroxysmal tachycardia, unspecified: Secondary | ICD-10-CM | POA: Diagnosis not present

## 2011-06-16 DIAGNOSIS — G9340 Encephalopathy, unspecified: Secondary | ICD-10-CM | POA: Diagnosis present

## 2011-06-16 DIAGNOSIS — J189 Pneumonia, unspecified organism: Secondary | ICD-10-CM | POA: Diagnosis present

## 2011-06-16 DIAGNOSIS — I10 Essential (primary) hypertension: Secondary | ICD-10-CM | POA: Diagnosis present

## 2011-06-16 DIAGNOSIS — J9600 Acute respiratory failure, unspecified whether with hypoxia or hypercapnia: Secondary | ICD-10-CM | POA: Diagnosis present

## 2011-06-16 DIAGNOSIS — Z79899 Other long term (current) drug therapy: Secondary | ICD-10-CM

## 2011-06-16 DIAGNOSIS — Z7982 Long term (current) use of aspirin: Secondary | ICD-10-CM

## 2011-06-16 DIAGNOSIS — Z8673 Personal history of transient ischemic attack (TIA), and cerebral infarction without residual deficits: Secondary | ICD-10-CM

## 2011-06-16 DIAGNOSIS — E860 Dehydration: Secondary | ICD-10-CM | POA: Diagnosis present

## 2011-06-16 HISTORY — DX: Essential (primary) hypertension: I10

## 2011-06-16 HISTORY — DX: Retention of urine, unspecified: R33.9

## 2011-06-16 HISTORY — DX: Restlessness and agitation: R45.1

## 2011-06-16 HISTORY — DX: Cerebral infarction, unspecified: I63.9

## 2011-06-16 HISTORY — DX: Acute embolism and thrombosis of unspecified deep veins of unspecified lower extremity: I82.409

## 2011-06-16 HISTORY — DX: Disorientation, unspecified: R41.0

## 2011-06-16 HISTORY — DX: Benign prostatic hyperplasia without lower urinary tract symptoms: N40.0

## 2011-06-16 HISTORY — DX: Heart failure, unspecified: I50.9

## 2011-06-16 LAB — URINALYSIS, MICROSCOPIC ONLY
Bilirubin Urine: NEGATIVE
Ketones, ur: NEGATIVE mg/dL
Nitrite: NEGATIVE
pH: 5 (ref 5.0–8.0)

## 2011-06-16 LAB — BASIC METABOLIC PANEL
BUN: 21 mg/dL (ref 6–23)
Creatinine, Ser: 1.36 mg/dL — ABNORMAL HIGH (ref 0.50–1.35)
GFR calc Af Amer: 51 mL/min — ABNORMAL LOW (ref >90)
GFR calc non Af Amer: 44 mL/min — ABNORMAL LOW (ref >90)
Glucose, Bld: 127 mg/dL — ABNORMAL HIGH (ref 70–99)

## 2011-06-16 LAB — DIFFERENTIAL
Basophils Relative: 0 % (ref 0–1)
Eosinophils Absolute: 0 10*3/uL (ref 0.0–0.7)
Monocytes Absolute: 0.4 10*3/uL (ref 0.1–1.0)
Monocytes Relative: 7 % (ref 3–12)

## 2011-06-16 LAB — CBC
HCT: 35.9 % — ABNORMAL LOW (ref 39.0–52.0)
Hemoglobin: 12.1 g/dL — ABNORMAL LOW (ref 13.0–17.0)
MCH: 31.7 pg (ref 26.0–34.0)
MCHC: 33.7 g/dL (ref 30.0–36.0)
MCV: 94 fL (ref 78.0–100.0)

## 2011-06-16 MED ORDER — SODIUM CHLORIDE 0.9 % IV BOLUS (SEPSIS)
1000.0000 mL | Freq: Once | INTRAVENOUS | Status: AC
  Administered 2011-06-16: 1000 mL via INTRAVENOUS

## 2011-06-16 MED ORDER — ACETAMINOPHEN 325 MG PO TABS
650.0000 mg | ORAL_TABLET | Freq: Once | ORAL | Status: AC
  Administered 2011-06-16: 650 mg via ORAL
  Filled 2011-06-16: qty 2

## 2011-06-16 MED ORDER — SODIUM CHLORIDE 0.9 % IV SOLN
INTRAVENOUS | Status: DC
  Administered 2011-06-16: 22:00:00 via INTRAVENOUS

## 2011-06-16 MED ORDER — POTASSIUM CHLORIDE 20 MEQ/15ML (10%) PO LIQD
40.0000 meq | Freq: Once | ORAL | Status: AC
  Administered 2011-06-16: 40 meq via ORAL
  Filled 2011-06-16: qty 30

## 2011-06-16 MED ORDER — DEXTROSE 5 % IV SOLN
2.0000 g | Freq: Once | INTRAVENOUS | Status: AC
  Administered 2011-06-16: 2 g via INTRAVENOUS
  Filled 2011-06-16: qty 2

## 2011-06-16 MED ORDER — VANCOMYCIN HCL IN DEXTROSE 1-5 GM/200ML-% IV SOLN
1000.0000 mg | Freq: Once | INTRAVENOUS | Status: AC
  Administered 2011-06-16: 1000 mg via INTRAVENOUS
  Filled 2011-06-16: qty 200

## 2011-06-16 NOTE — H&P (Signed)
PCP:   Baltazar Najjar M.D.   CODE STATUS: DO NOT RESUSCITATE  Chief Complaint:  Fever, cough confusion 3 days   HPI: Anthony Leon is an 78 y.o. male.   Resident of the [facility], usually ambulates with a, for the past few days has been lethargic confused and has a fever cough. Emergency room patient was noted to be dehydrated so although the chest x-ray on the chronic changes, smokeless service was called to assist with management of her probable pneumonia.  Patient is too weak and somewhat delirious to give a clear, and most of the history is obtained by reviewing the records, and talking with her sister who is at his bedside.  Rewiew of Systems:  The patient denies anorexia, , weight loss,, vision loss, decreased hearing, hoarseness, chest pain, syncope, dyspnea on exertion, peripheral edema, balance deficits, hemoptysis, abdominal pain, melena, hematochezia, severe indigestion/heartburn, hematuria, incontinence, genital sores, muscle weakness, suspicious skin lesions, transient blindness, depression, unusual weight change, abnormal bleeding, enlarged lymph nodes, angioedema, and breast masses.    Past Medical History  Diagnosis Date  . CHF (congestive heart failure)   . Glaucoma   . Hypertension   . Urinary retention   . Agitation   . Confusion   . DVT (deep venous thrombosis)   . BPH (benign prostatic hyperplasia)   . Delirium   . CVA (cerebral infarction)     Past Surgical History  Procedure Date  . Foot fracture surgery   . Left femur fracture repair     Medications:  HOME MEDS: Prior to Admission medications   Medication Sig Start Date End Date Taking? Authorizing Provider  acetaminophen (TYLENOL) 500 MG tablet Take 1,000 mg by mouth 2 (two) times daily.   Yes Historical Provider, MD  ALPRAZolam (XANAX) 0.25 MG tablet Take 0.125 mg by mouth at bedtime as needed. For anxiety   Yes Historical Provider, MD  amLODipine (NORVASC) 5 MG tablet Take 5 mg by mouth  daily.   Yes Historical Provider, MD  bimatoprost (LUMIGAN) 0.01 % SOLN Apply 1 drop to eye at bedtime.   Yes Historical Provider, MD  calcium-vitamin D (OSCAL WITH D) 500-200 MG-UNIT per tablet Take 1 tablet by mouth daily.   Yes Historical Provider, MD  dipyridamole-aspirin (AGGRENOX) 25-200 MG per 12 hr capsule Take 1 capsule by mouth 2 (two) times daily.   Yes Historical Provider, MD  fluticasone (FLONASE) 50 MCG/ACT nasal spray Place 2 sprays into the nose daily.   Yes Historical Provider, MD  furosemide (LASIX) 20 MG tablet Take 20 mg by mouth daily.   Yes Historical Provider, MD  guaiFENesin (MUCINEX) 600 MG 12 hr tablet Take 1,200 mg by mouth 2 (two) times daily. For 7 days 06/14/11 06/21/11 Yes Historical Provider, MD  ipratropium-albuterol (DUONEB) 0.5-2.5 (3) MG/3ML SOLN Take 3 mLs by nebulization every 6 (six) hours as needed.   Yes Historical Provider, MD  metoprolol tartrate (LOPRESSOR) 25 MG tablet Take 12.5 mg by mouth 2 (two) times daily.   Yes Historical Provider, MD  sodium chloride (OCEAN) 0.65 % nasal spray Place 2 sprays into the nose 2 (two) times daily.   Yes Historical Provider, MD  sulfamethoxazole-trimethoprim (BACTRIM DS) 800-160 MG per tablet Take 1 tablet by mouth 2 (two) times daily. For 7 days   Yes Historical Provider, MD  Tamsulosin HCl (FLOMAX) 0.4 MG CAPS Take 0.4 mg by mouth daily.   Yes Historical Provider, MD     Allergies:  Allergies  Allergen Reactions  .  Ciprofloxacin   . Penicillins   . Tetracyclines & Related   . Tramadol     Social History:   does not have a smoking history on file. He does not have any smokeless tobacco history on file. He reports that he does not drink alcohol or use illicit drugs.  Family History: History reviewed. No pertinent family history.   Physical Exam: Filed Vitals:   06/16/11 1913 06/16/11 2225 06/16/11 2340  BP: 144/63 109/45 122/69  Pulse: 101 89 102  Temp: 101.6 F (38.7 C) 98.3 F (36.8 C) 98.4 F (36.9  C)  TempSrc: Oral Oral Oral  Resp: 18 20 20   Height:   5\' 11"  (1.803 m)  Weight:   85.4 kg (188 lb 4.4 oz)  SpO2: 94% 96% 95%   Blood pressure 122/69, pulse 102, temperature 98.4 F (36.9 C), temperature source Oral, resp. rate 20, height 5\' 11"  (1.803 m), weight 85.4 kg (188 lb 4.4 oz), SpO2 95.00%.  GEN:  Ill-looking elderly man lying in the stretcher  PSYCH:  alert and oriented x2; appears confused in conversation; somewhat hard of HEENT: Mucous membranes pink dry and anicteric; PERRLA; EOM intact; no JVD; Breasts:: Not examined CHEST WALL: No tenderness CHEST: Coarse bibasilar crackles at bases HEART: Regular rate and rhythm; no murmurs rubs or gallops BACK:  kyphosis no scoliosis; no CVA tenderness ABDOMEN: Obese, soft non-tender; no masses, no organomegaly, normal abdominal bowel sounds; no pannus; no intertriginous candida. Rectal Exam: Not done EXTREMITIES:  age-appropriate arthropathy of the hands and knees; no edema; no ulcerations. Genitalia: not examined PULSES: 2+ and symmetric SKIN: Normal hydration no rash or ulceration CNS: Cranial nerves 2-12 grossly intact no focal lateralizing neurologic deficit   Labs & Imaging Results for orders placed during the hospital encounter of 06/16/11 (from the past 48 hour(s))  CBC     Status: Abnormal   Collection Time   06/16/11  8:21 PM      Component Value Range Comment   WBC 5.8  4.0 - 10.5 (K/uL)    RBC 3.82 (*) 4.22 - 5.81 (MIL/uL)    Hemoglobin 12.1 (*) 13.0 - 17.0 (g/dL)    HCT 98.1 (*) 19.1 - 52.0 (%)    MCV 94.0  78.0 - 100.0 (fL)    MCH 31.7  26.0 - 34.0 (pg)    MCHC 33.7  30.0 - 36.0 (g/dL)    RDW 47.8  29.5 - 62.1 (%)    Platelets 146 (*) 150 - 400 (K/uL)   DIFFERENTIAL     Status: Normal   Collection Time   06/16/11  8:21 PM      Component Value Range Comment   Neutrophils Relative 71  43 - 77 (%)    Neutro Abs 4.1  1.7 - 7.7 (K/uL)    Lymphocytes Relative 22  12 - 46 (%)    Lymphs Abs 1.3  0.7 - 4.0 (K/uL)      Monocytes Relative 7  3 - 12 (%)    Monocytes Absolute 0.4  0.1 - 1.0 (K/uL)    Eosinophils Relative 0  0 - 5 (%)    Eosinophils Absolute 0.0  0.0 - 0.7 (K/uL)    Basophils Relative 0  0 - 1 (%)    Basophils Absolute 0.0  0.0 - 0.1 (K/uL)   BASIC METABOLIC PANEL     Status: Abnormal   Collection Time   06/16/11  8:21 PM      Component Value Range Comment   Sodium  129 (*) 135 - 145 (mEq/L)    Potassium 3.0 (*) 3.5 - 5.1 (mEq/L)    Chloride 95 (*) 96 - 112 (mEq/L)    CO2 22  19 - 32 (mEq/L)    Glucose, Bld 127 (*) 70 - 99 (mg/dL)    BUN 21  6 - 23 (mg/dL)    Creatinine, Ser 1.47 (*) 0.50 - 1.35 (mg/dL)    Calcium 8.6  8.4 - 10.5 (mg/dL)    GFR calc non Af Amer 44 (*) >90 (mL/min)    GFR calc Af Amer 51 (*) >90 (mL/min)   TROPONIN I     Status: Normal   Collection Time   06/16/11  8:21 PM      Component Value Range Comment   Troponin I <0.30  <0.30 (ng/mL)   CULTURE, BLOOD (ROUTINE X 2)     Status: Normal (Preliminary result)   Collection Time   06/16/11  8:26 PM      Component Value Range Comment   Specimen Description BLOOD LEFT HAND      Special Requests BOTTLES DRAWN AEROBIC AND ANAEROBIC 5CC      Culture PENDING      Report Status PENDING     CULTURE, BLOOD (ROUTINE X 2)     Status: Normal (Preliminary result)   Collection Time   06/16/11  8:43 PM      Component Value Range Comment   Specimen Description LEFT ANTECUBITAL      Special Requests BOTTLES DRAWN AEROBIC AND ANAEROBIC 5CC      Culture PENDING      Report Status PENDING     LACTIC ACID, PLASMA     Status: Normal   Collection Time   06/16/11  8:44 PM      Component Value Range Comment   Lactic Acid, Venous 1.3  0.5 - 2.2 (mmol/L)   URINALYSIS, WITH MICROSCOPIC     Status: Abnormal   Collection Time   06/16/11  8:46 PM      Component Value Range Comment   Color, Urine YELLOW  YELLOW     APPearance HAZY (*) CLEAR     Specific Gravity, Urine 1.025  1.005 - 1.030     pH 5.0  5.0 - 8.0     Glucose, UA NEGATIVE   NEGATIVE (mg/dL)    Hgb urine dipstick MODERATE (*) NEGATIVE     Bilirubin Urine NEGATIVE  NEGATIVE     Ketones, ur NEGATIVE  NEGATIVE (mg/dL)    Protein, ur 30 (*) NEGATIVE (mg/dL)    Urobilinogen, UA 0.2  0.0 - 1.0 (mg/dL)    Nitrite NEGATIVE  NEGATIVE     Leukocytes, UA NEGATIVE  NEGATIVE     WBC, UA 0-2  <3 (WBC/hpf)    RBC / HPF 3-6  <3 (RBC/hpf)    Bacteria, UA FEW (*) RARE     Squamous Epithelial / LPF FEW (*) RARE     Urine-Other AMORPHOUS URATES/PHOSPHATES      Dg Chest Port 1 View  06/16/2011  *RADIOLOGY REPORT*  Clinical Data: Cough, shortness of breath, hypoxia, fever, left lower lobe rales.  PORTABLE CHEST - 1 VIEW  Comparison: 06/14/2011  Findings: Cardiac enlargement with normal pulmonary vascularity. Calcified pleural plaques and pleural thickening diffusely and bilaterally, stable since prior study.  No definite evidence of any focal airspace consolidation.  No blunting of costophrenic angles. No pneumothorax.  Calcification of the aorta.  Prominent soft tissue in the right paratracheal region may represent vascular  shadow or thyroid goiter.  Degenerative changes in the shoulders.  IMPRESSION: Cardiac enlargement.  Diffuse bilateral calcified pleural plaques and pleural thickening.  Stable appearance since previous study.  Original Report Authenticated By: Marlon Pel, M.D.      Assessment Present on Admission:  .Dehydration .Healthcare-associated pneumonia .Delirium due to general medical condition .CHF, chronic .BPH (benign prostatic hyperplasia) .Acute respiratory failure   PLAN: Admit this gentleman for hydration and treatment pneumonia. Review once he is adequately rehydrated his mental status will improve and he can be discharged back to the skilled nursing facility.  Other plans as per orders.   Rochanda Harpham 06/16/2011, 11:42 PM

## 2011-06-16 NOTE — ED Provider Notes (Signed)
History   This chart was scribed for Felisa Bonier, MD by Sofie Rower. The patient was seen in room APA02/APA02 and the patient's care was started at 8:06 PM     CSN: [id number]  Arrival date & time 06/16/11  1903   First MD Initiated Contact with Patient 06/16/11 1913      Chief Complaint  Patient presents with  . Fever    (Consider location/radiation/quality/duration/timing/severity/associated sxs/prior treatment) HPI  Anthony Leon is a 78 y.o. male who presents to the Emergency Department complaining of moderate, episodic fever onset three days ago with associated symptoms of chills, diarrhea, cough, shortness of breath, dehydration, confusion. Pt wife states "pt started coughing and feeling bad on Monday night." Pt wife states "she is not sure whether or not the pt recognizes her. Pt has a hx of x-ray on Tuesday morning (06/14/11), asbestos, "accident two years ago".   Pt is a resident at [facility].        Past Medical History  Diagnosis Date  . CHF (congestive heart failure)   . Glaucoma   . Hypertension   . Urinary retention   . Agitation   . Confusion   . DVT (deep venous thrombosis)   . BPH (benign prostatic hyperplasia)   . Delirium   . CVA (cerebral infarction)     Past Surgical History  Procedure Date  . Foot fracture surgery   . Left femur fracture repair       History  Substance Use Topics  . Smoking status: Not on file  . Smokeless tobacco: Not on file  . Alcohol Use: No      Review of Systems  All other systems reviewed and are negative.    10 Systems reviewed and all are negative for acute change except as noted in the HPI.    Allergies  Ciprofloxacin; Penicillins; Tetracyclines & related; and Tramadol  Home Medications   Current Outpatient Rx  Name Route Sig Dispense Refill  . ACETAMINOPHEN 500 MG PO TABS Oral Take 1,000 mg by mouth 2 (two) times daily.    Marland Kitchen ALPRAZOLAM 0.25 MG PO TABS Oral Take 0.125 mg by mouth at  bedtime as needed. For anxiety    . AMLODIPINE BESYLATE 5 MG PO TABS Oral Take 5 mg by mouth daily.    Marland Kitchen BIMATOPROST 0.01 % OP SOLN Ophthalmic Apply 1 drop to eye at bedtime.    Marland Kitchen CALCIUM CARBONATE-VITAMIN D 500-200 MG-UNIT PO TABS Oral Take 1 tablet by mouth daily.    . ASPIRIN-DIPYRIDAMOLE ER 25-200 MG PO CP12 Oral Take 1 capsule by mouth 2 (two) times daily.    Marland Kitchen FLUTICASONE PROPIONATE 50 MCG/ACT NA SUSP Nasal Place 2 sprays into the nose daily.    . FUROSEMIDE 20 MG PO TABS Oral Take 20 mg by mouth daily.    . GUAIFENESIN ER 600 MG PO TB12 Oral Take 1,200 mg by mouth 2 (two) times daily. For 7 days    . IPRATROPIUM-ALBUTEROL 0.5-2.5 (3) MG/3ML IN SOLN Nebulization Take 3 mLs by nebulization every 6 (six) hours as needed.    Marland Kitchen METOPROLOL TARTRATE 25 MG PO TABS Oral Take 12.5 mg by mouth 2 (two) times daily.    . SODIUM CHLORIDE 0.65 % NA SOLN Nasal Place 2 sprays into the nose 2 (two) times daily.    . SULFAMETHOXAZOLE-TMP DS 800-160 MG PO TABS Oral Take 1 tablet by mouth 2 (two) times daily. For 7 days    . TAMSULOSIN HCL  0.4 MG PO CAPS Oral Take 0.4 mg by mouth daily.      BP 144/63  Pulse 101  Temp(Src) 101.6 F (38.7 C) (Oral)  Resp 18  SpO2 94%  Physical Exam  Nursing note and vitals reviewed. Constitutional: He is oriented to person, place, and time. He appears well-developed and well-nourished.  HENT:  Head: Normocephalic and atraumatic.  Right Ear: Tympanic membrane, external ear and ear canal normal.  Left Ear: Tympanic membrane, external ear and ear canal normal.  Nose: Nose normal.       Mucus membranes are dry,   Eyes: Conjunctivae and EOM are normal. Pupils are equal, round, and reactive to light. No scleral icterus.  Neck: Neck supple. No thyromegaly present.  Cardiovascular: Regular rhythm.  Tachycardia present.  Exam reveals no gallop and no friction rub.   No murmur heard. Pulmonary/Chest: No stridor. Tachypnea noted. He has no wheezes. He has rales (Left lower  lobe. ). He exhibits no tenderness.       Shallower breaths.  Abdominal: Soft. Bowel sounds are normal. He exhibits no distension and no mass. There is no tenderness. There is no rebound and no guarding.  Musculoskeletal: Normal range of motion. He exhibits no edema.  Lymphadenopathy:    He has no cervical adenopathy.  Neurological: He is oriented to person, place, and time. Coordination normal.  Skin: No rash noted. No erythema.  Psychiatric: He has a normal mood and affect. His behavior is normal.    ED Course  Procedures (including critical care time)  DIAGNOSTIC STUDIES: Oxygen Saturation is 94% on room air, adequate by my interpretation.    COORDINATION OF CARE:   Date: 06/16/2011  Rate: 102  Rhythm: sinus tachycardia and premature ventricular contractions (PVC)  QRS Axis: normal  Intervals: normal  ST/T Wave abnormalities: normal  Conduction Disutrbances:none  Narrative Interpretation: Non-provocative ECG  Old EKG Reviewed: unchanged     Labs Reviewed - No data to display No results found.   No diagnosis found.   8:20PM- EDP at bedside discusses treatment plan.   MDM  The patient is presenting from the nursing facility where he is been noted over the past few days to have increasing shortness of breath, cough, now requiring oxygen to maintain adequate oxygen saturation, and fever today, with Rales heard at the left lung base suggestive of pneumonia. Additionally, the patient has had diarrhea over the last 2 days. He has increased confusion according to his family. The patient appears highly likely on a clinical basis to have pneumonia. He is apparently quite dehydrated. It would not surprise me if his chest x-ray did not show an obvious pneumonia at this time due to dehydration. I have ordered antibiotic treatment empirically for healthcare associated pneumonia aced on his examination. The antibiotics chosen would also cover for urinary tract infection, should that be  apparent after testing. I have ordered IV fluid rehydration for the patient given his apparent dehydrated state, and do support his circulatory functions. The patient will need admission to the hospital.     I personally performed the services described in this documentation, which was scribed in my presence. The recorded information has been reviewed and considered.   Felisa Bonier, MD 06/16/11 2102

## 2011-06-16 NOTE — ED Notes (Signed)
Patient complaining of being "uncomfortable in this bed." Patient is still on [facility]. Moved patient to ED stretcher. Patient states he "feels much better." Patient resting in bed with head of bed elevated 45 degrees. No complaints of pain or distress at this time. Personal sitter at bedside.

## 2011-06-16 NOTE — ED Notes (Signed)
Sent here from [facility] for eval of fever and diarrhea

## 2011-06-17 ENCOUNTER — Inpatient Hospital Stay (HOSPITAL_COMMUNITY): Payer: Medicare Other

## 2011-06-17 LAB — COMPREHENSIVE METABOLIC PANEL
ALT: 14 U/L (ref 0–53)
Alkaline Phosphatase: 44 U/L (ref 39–117)
BUN: 23 mg/dL (ref 6–23)
CO2: 20 mEq/L (ref 19–32)
Chloride: 99 mEq/L (ref 96–112)
GFR calc Af Amer: 46 mL/min — ABNORMAL LOW (ref >90)
Glucose, Bld: 96 mg/dL (ref 70–99)
Potassium: 3.7 mEq/L (ref 3.5–5.1)
Sodium: 130 mEq/L — ABNORMAL LOW (ref 135–145)
Total Bilirubin: 1.3 mg/dL — ABNORMAL HIGH (ref 0.3–1.2)

## 2011-06-17 LAB — CBC
HCT: 33.1 % — ABNORMAL LOW (ref 39.0–52.0)
Hemoglobin: 11.3 g/dL — ABNORMAL LOW (ref 13.0–17.0)
RBC: 3.51 MIL/uL — ABNORMAL LOW (ref 4.22–5.81)
WBC: 9 10*3/uL (ref 4.0–10.5)

## 2011-06-17 MED ORDER — BIMATOPROST 0.01 % OP SOLN
1.0000 [drp] | Freq: Every day | OPHTHALMIC | Status: DC
  Administered 2011-06-17 – 2011-06-21 (×5): 1 [drp] via OPHTHALMIC
  Filled 2011-06-17: qty 2.5

## 2011-06-17 MED ORDER — POTASSIUM CHLORIDE IN NACL 20-0.9 MEQ/L-% IV SOLN
INTRAVENOUS | Status: DC
  Administered 2011-06-17 – 2011-06-18 (×3): via INTRAVENOUS

## 2011-06-17 MED ORDER — ALBUTEROL SULFATE (5 MG/ML) 0.5% IN NEBU: 2.5000 mg | INHALATION_SOLUTION | Freq: Four times a day (QID) | RESPIRATORY_TRACT | Status: DC | PRN

## 2011-06-17 MED ORDER — TAMSULOSIN HCL 0.4 MG PO CAPS
0.4000 mg | ORAL_CAPSULE | Freq: Every day | ORAL | Status: DC
  Administered 2011-06-17 – 2011-06-22 (×6): 0.4 mg via ORAL
  Filled 2011-06-17 (×6): qty 1

## 2011-06-17 MED ORDER — FLUTICASONE PROPIONATE 50 MCG/ACT NA SUSP
2.0000 | Freq: Every day | NASAL | Status: DC
  Administered 2011-06-17 – 2011-06-22 (×6): 2 via NASAL
  Filled 2011-06-17: qty 16

## 2011-06-17 MED ORDER — AMLODIPINE BESYLATE 5 MG PO TABS
5.0000 mg | ORAL_TABLET | Freq: Every day | ORAL | Status: DC
  Administered 2011-06-17 – 2011-06-22 (×6): 5 mg via ORAL
  Filled 2011-06-17 (×5): qty 1

## 2011-06-17 MED ORDER — DEXTROSE 5 % IV SOLN
INTRAVENOUS | Status: AC
  Filled 2011-06-17: qty 1

## 2011-06-17 MED ORDER — IPRATROPIUM BROMIDE 0.02 % IN SOLN: 0.5000 mg | Freq: Four times a day (QID) | RESPIRATORY_TRACT | Status: DC | PRN

## 2011-06-17 MED ORDER — METOPROLOL TARTRATE 25 MG PO TABS
12.5000 mg | ORAL_TABLET | Freq: Two times a day (BID) | ORAL | Status: DC
  Administered 2011-06-17 (×2): 12.5 mg via ORAL
  Filled 2011-06-17 (×4): qty 1

## 2011-06-17 MED ORDER — DEXTROSE 5 % IV SOLN
2.0000 g | INTRAVENOUS | Status: DC
  Administered 2011-06-18 – 2011-06-20 (×3): 2 g via INTRAVENOUS
  Filled 2011-06-17 (×3): qty 2

## 2011-06-17 MED ORDER — ACETAMINOPHEN 325 MG PO TABS
ORAL_TABLET | ORAL | Status: AC
  Filled 2011-06-17: qty 2

## 2011-06-17 MED ORDER — ENOXAPARIN SODIUM 30 MG/0.3ML ~~LOC~~ SOLN
30.0000 mg | Freq: Every day | SUBCUTANEOUS | Status: DC
  Administered 2011-06-17 – 2011-06-19 (×3): 30 mg via SUBCUTANEOUS
  Filled 2011-06-17 (×3): qty 0.3

## 2011-06-17 MED ORDER — SALINE SPRAY 0.65 % NA SOLN
2.0000 | Freq: Two times a day (BID) | NASAL | Status: DC
  Administered 2011-06-17 – 2011-06-22 (×12): 2 via NASAL
  Filled 2011-06-17 (×19): qty 44

## 2011-06-17 MED ORDER — DEXTROSE 5 % IV SOLN
1.0000 g | Freq: Three times a day (TID) | INTRAVENOUS | Status: DC
  Administered 2011-06-17: 1 g via INTRAVENOUS

## 2011-06-17 MED ORDER — ASPIRIN-DIPYRIDAMOLE ER 25-200 MG PO CP12
1.0000 | ORAL_CAPSULE | Freq: Two times a day (BID) | ORAL | Status: DC
  Administered 2011-06-17 – 2011-06-22 (×11): 1 via ORAL
  Filled 2011-06-17 (×15): qty 1

## 2011-06-17 MED ORDER — VANCOMYCIN HCL 1000 MG IV SOLR
1250.0000 mg | INTRAVENOUS | Status: DC
  Administered 2011-06-17 – 2011-06-19 (×3): 1250 mg via INTRAVENOUS
  Filled 2011-06-17 (×3): qty 1250

## 2011-06-17 MED ORDER — ALPRAZOLAM 0.25 MG PO TABS
0.1250 mg | ORAL_TABLET | Freq: Every evening | ORAL | Status: DC | PRN
  Administered 2011-06-19: 0.125 mg via ORAL
  Filled 2011-06-17: qty 1

## 2011-06-17 MED ORDER — GUAIFENESIN ER 600 MG PO TB12
1200.0000 mg | ORAL_TABLET | Freq: Two times a day (BID) | ORAL | Status: DC
  Administered 2011-06-17 – 2011-06-22 (×11): 1200 mg via ORAL
  Filled 2011-06-17 (×12): qty 2

## 2011-06-17 MED ORDER — SODIUM CHLORIDE 0.9 % IV SOLN: INTRAVENOUS | Status: DC

## 2011-06-17 NOTE — Progress Notes (Signed)
Subjective: Patient appears to be confused, cannot provide much history.  His sister reports that he has been coughing, short of breath and febrile.  Objective: Vital signs in last 24 hours: Temp:  [98.3 F (36.8 C)-101.6 F (38.7 C)] 98.4 F (36.9 C) (04/05 1456) Pulse Rate:  [85-102] 85  (04/05 1456) Resp:  [18-20] 20  (04/05 1456) BP: (109-149)/(45-72) 112/69 mmHg (04/05 1456) SpO2:  [91 %-96 %] 95 % (04/05 1456) Weight:  [85.4 kg (188 lb 4.4 oz)] 85.4 kg (188 lb 4.4 oz) (04/05 0539) Weight change:  Last BM Date: 06/17/11  Intake/Output from previous day: 04/04 0701 - 04/05 0700 In: -  Out: 300 [Urine:300] Total I/O In: 360 [P.O.:360] Out: -    Physical Exam: General: Alert, awake, confused, in no acute distress. HEENT: No bruits, no goiter. Heart: Regular rate and rhythm, without murmurs, rubs, gallops. Lungs: rhonchi b/l. Abdomen: Soft, nontender, nondistended, positive bowel sounds. Extremities: No clubbing cyanosis or edema with positive pedal pulses. Neuro: Grossly intact, nonfocal.    Lab Results: Basic Metabolic Panel:  Basename 06/17/11 0541 06/16/11 2021  NA 130* 129*  K 3.7 3.0*  CL 99 95*  CO2 20 22  GLUCOSE 96 127*  BUN 23 21  CREATININE 1.48* 1.36*  CALCIUM 8.1* 8.6  MG -- --  PHOS -- --   Liver Function Tests:  Basename 06/17/11 0541  AST 27  ALT 14  ALKPHOS 44  BILITOT 1.3*  PROT 5.9*  ALBUMIN 2.7*   No results found for this basename: LIPASE:2,AMYLASE:2 in the last 72 hours No results found for this basename: AMMONIA:2 in the last 72 hours CBC:  Basename 06/17/11 0541 06/16/11 2021  WBC 9.0 5.8  NEUTROABS -- 4.1  HGB 11.3* 12.1*  HCT 33.1* 35.9*  MCV 94.3 94.0  PLT 131* 146*   Cardiac Enzymes:  Basename 06/16/11 2021  CKTOTAL --  CKMB --  CKMBINDEX --  TROPONINI <0.30   BNP:  Basename 06/17/11 0541  PROBNP 7908.0*   D-Dimer: No results found for this basename: DDIMER:2 in the last 72 hours CBG: No results  found for this basename: GLUCAP:6 in the last 72 hours Hemoglobin A1C: No results found for this basename: HGBA1C in the last 72 hours Fasting Lipid Panel: No results found for this basename: CHOL,HDL,LDLCALC,TRIG,CHOLHDL,LDLDIRECT in the last 72 hours Thyroid Function Tests: No results found for this basename: TSH,T4TOTAL,FREET4,T3FREE,THYROIDAB in the last 72 hours Anemia Panel: No results found for this basename: VITAMINB12,FOLATE,FERRITIN,TIBC,IRON,RETICCTPCT in the last 72 hours Coagulation: No results found for this basename: LABPROT:2,INR:2 in the last 72 hours Urine Drug Screen: Drugs of Abuse  No results found for this basename: labopia, cocainscrnur, labbenz, amphetmu, thcu, labbarb    Alcohol Level: No results found for this basename: ETH:2 in the last 72 hours Urinalysis:  Basename 06/16/11 2046  COLORURINE YELLOW  LABSPEC 1.025  PHURINE 5.0  GLUCOSEU NEGATIVE  HGBUR MODERATE*  BILIRUBINUR NEGATIVE  KETONESUR NEGATIVE  PROTEINUR 30*  UROBILINOGEN 0.2  NITRITE NEGATIVE  LEUKOCYTESUR NEGATIVE    Recent Results (from the past 240 hour(s))  CULTURE, BLOOD (ROUTINE X 2)     Status: Normal (Preliminary result)   Collection Time   06/16/11  8:26 PM      Component Value Range Status Comment   Specimen Description BLOOD LEFT HAND   Final    Special Requests BOTTLES DRAWN AEROBIC AND ANAEROBIC 5CC   Final    Culture NO GROWTH 1 DAY   Final    Report Status  PENDING   Incomplete   CULTURE, BLOOD (ROUTINE X 2)     Status: Normal (Preliminary result)   Collection Time   06/16/11  8:43 PM      Component Value Range Status Comment   Specimen Description LEFT ANTECUBITAL   Final    Special Requests BOTTLES DRAWN AEROBIC AND ANAEROBIC 5CC   Final    Culture NO GROWTH 1 DAY   Final    Report Status PENDING   Incomplete   CULTURE, SPUTUM-ASSESSMENT     Status: Normal (Preliminary result)   Collection Time   06/17/11 10:34 AM      Component Value Range Status Comment   Specimen  Description SPUTUM   Final    Special Requests NONE   Final    Sputum evaluation     Final    Value: THIS SPECIMEN IS ACCEPTABLE. RESPIRATORY CULTURE REPORT TO FOLLOW.   Report Status PENDING   Incomplete     Studies/Results: Dg Chest Port 1 View  06/16/2011  *RADIOLOGY REPORT*  Clinical Data: Cough, shortness of breath, hypoxia, fever, left lower lobe rales.  PORTABLE CHEST - 1 VIEW  Comparison: 06/14/2011  Findings: Cardiac enlargement with normal pulmonary vascularity. Calcified pleural plaques and pleural thickening diffusely and bilaterally, stable since prior study.  No definite evidence of any focal airspace consolidation.  No blunting of costophrenic angles. No pneumothorax.  Calcification of the aorta.  Prominent soft tissue in the right paratracheal region may represent vascular shadow or thyroid goiter.  Degenerative changes in the shoulders.  IMPRESSION: Cardiac enlargement.  Diffuse bilateral calcified pleural plaques and pleural thickening.  Stable appearance since previous study.  Original Report Authenticated By: Marlon Pel, M.D.    Medications: Scheduled Meds:   . acetaminophen      . acetaminophen  650 mg Oral Once  . amLODipine  5 mg Oral Daily  . bimatoprost  1 drop Both Eyes QHS  . ceFEPIme (MAXIPIME) 2 GM IVP  2 g Intravenous Once  . ceFEPime (MAXIPIME) IV  2 g Intravenous Q24H  . dipyridamole-aspirin  1 capsule Oral BID  . enoxaparin  30 mg Subcutaneous Daily  . fluticasone  2 spray Each Nare Daily  . guaiFENesin  1,200 mg Oral BID  . metoprolol tartrate  12.5 mg Oral BID  . potassium chloride  40 mEq Oral Once  . sodium chloride  2 spray Nasal BID  . sodium chloride  1,000 mL Intravenous Once  . Tamsulosin HCl  0.4 mg Oral Daily  . vancomycin  1,250 mg Intravenous Q24H  . vancomycin  1,000 mg Intravenous Once  . DISCONTD: sodium chloride   Intravenous STAT  . DISCONTD: ceFEPime (MAXIPIME) IV  1 g Intravenous Q8H   Continuous Infusions:   . 0.9 %  NaCl with KCl 20 mEq / L 100 mL/hr at 06/17/11 0052  . DISCONTD: sodium chloride 125 mL/hr at 06/16/11 2221   PRN Meds:.albuterol, ALPRAZolam, ipratropium  Assessment/Plan:  Active Problems:  Dehydration  Healthcare-associated pneumonia  Delirium due to general medical condition  CHF, chronic  BPH (benign prostatic hyperplasia)  Acute respiratory failure  Plan:  Continue broad spectrum antibiotics for presumed HCAP, repeat cxr in am to check for developing infiltrates Continue gentle hydration and recheck renal function in morning, monitor Is and Os Hopefully mental status should improve as hydration status and pneumonia improve  Dispo will be back to SNF once improved.   LOS: 1 day   Mirai Greenwood [facility] Pager: [phone] 06/17/2011, 4:29  PM

## 2011-06-17 NOTE — Progress Notes (Signed)
Clinical Social Work Department BRIEF PSYCHOSOCIAL ASSESSMENT 06/17/2011  Patient:  Anthony Leon, Anthony Leon     Account Number:  [id number]     Admit date:  06/16/2011  Clinical Social Worker:  Andres Shad  Date/Time:  06/17/2011 10:43 AM  Referred by:  Physician  Date Referred:  06/17/2011 Referred for  SNF Placement   Other Referral:   Patient admitted from nursing facility: [facility]   Interview type:  Patient Other interview type:   Chart Review and Family    PSYCHOSOCIAL DATA Living Status:  FACILITY Admitted from facility:  [facility] Level of care:  Skilled Nursing Facility Primary support name:  sister and a grandson Primary support relationship to patient:  FAMILY Degree of support available:   Very supportive    CURRENT CONCERNS Current Concerns  Post-Acute Placement   Other Concerns:    SOCIAL WORK ASSESSMENT / PLAN Recieved consult that patient is admitted from [facility] and CSW verified with faiclity this was accurate in which it is.  patient is able to return once medically stable and all is agreeable to plan.  CSW will facilitate dc plan once cleared   Assessment/plan status:  Psychosocial Support/Ongoing Assessment of Needs Other assessment/ plan:   none at this time   Information/referral to community resources:   FL2 updated    PATIENT'S/FAMILY'S RESPONSE TO PLAN OF CARE: All agreeable to return to [facility].   Ashley Jacobs, MSW LCSW [phone]

## 2011-06-17 NOTE — Progress Notes (Signed)
UR Chart Review Completed  

## 2011-06-17 NOTE — Progress Notes (Signed)
ANTIBIOTIC CONSULT NOTE - INITIAL  Pharmacy Consult for Vancomycin - Renal Dosing Antibiotics Indication: pneumonia (HCAP)  Allergies  Allergen Reactions  . Ciprofloxacin   . Penicillins   . Tetracyclines & Related   . Tramadol     Patient Measurements: Height: 5\' 11"  (180.3 cm) Weight: 188 lb 4.4 oz (85.4 kg) IBW/kg (Calculated) : 75.3   Vital Signs: Temp: 100.5 F (38.1 C) (04/05 0539) Temp src: Axillary (04/05 0539) BP: 116/66 mmHg (04/05 0539) Pulse Rate: 97  (04/05 0539) Intake/Output from previous day: 04/04 0701 - 04/05 0700 In: -  Out: 300 [Urine:300] Intake/Output from this shift:    Labs:  Basename 06/17/11 0541 06/16/11 2021  WBC 9.0 5.8  HGB 11.3* 12.1*  PLT 131* 146*  LABCREA -- --  CREATININE 1.48* 1.36*   Estimated Creatinine Clearance: 35.3 ml/min (by C-G formula based on Cr of 1.48). No results found for this basename: VANCOTROUGH:2,VANCOPEAK:2,VANCORANDOM:2,GENTTROUGH:2,GENTPEAK:2,GENTRANDOM:2,TOBRATROUGH:2,TOBRAPEAK:2,TOBRARND:2,AMIKACINPEAK:2,AMIKACINTROU:2,AMIKACIN:2, in the last 72 hours   Microbiology: Recent Results (from the past 720 hour(s))  CULTURE, BLOOD (ROUTINE X 2)     Status: Normal (Preliminary result)   Collection Time   06/16/11  8:26 PM      Component Value Range Status Comment   Specimen Description BLOOD LEFT HAND   Final    Special Requests BOTTLES DRAWN AEROBIC AND ANAEROBIC 5CC   Final    Culture PENDING   Incomplete    Report Status PENDING   Incomplete   CULTURE, BLOOD (ROUTINE X 2)     Status: Normal (Preliminary result)   Collection Time   06/16/11  8:43 PM      Component Value Range Status Comment   Specimen Description LEFT ANTECUBITAL   Final    Special Requests BOTTLES DRAWN AEROBIC AND ANAEROBIC 5CC   Final    Culture PENDING   Incomplete    Report Status PENDING   Incomplete     Medical History: Past Medical History  Diagnosis Date  . CHF (congestive heart failure)   . Glaucoma   . Hypertension   .  Urinary retention   . Agitation   . Confusion   . DVT (deep venous thrombosis)   . BPH (benign prostatic hyperplasia)   . Delirium   . CVA (cerebral infarction)     Medications:  Scheduled:    . acetaminophen      . acetaminophen  650 mg Oral Once  . amLODipine  5 mg Oral Daily  . bimatoprost  1 drop Both Eyes QHS  . ceFEPime (MAXIPIME) IV  1 g Intravenous Q8H  . ceFEPIme (MAXIPIME) 2 GM IVP  2 g Intravenous Once  . dipyridamole-aspirin  1 capsule Oral BID  . enoxaparin  30 mg Subcutaneous Daily  . fluticasone  2 spray Each Nare Daily  . guaiFENesin  1,200 mg Oral BID  . metoprolol tartrate  12.5 mg Oral BID  . potassium chloride  40 mEq Oral Once  . sodium chloride  2 spray Nasal BID  . sodium chloride  1,000 mL Intravenous Once  . Tamsulosin HCl  0.4 mg Oral Daily  . vancomycin  1,000 mg Intravenous Once  . DISCONTD: sodium chloride   Intravenous STAT   Assessment: Okay for Protocol Estimated Creatinine Clearance: 35.3 ml/min (by C-G formula based on Cr of 1.48). Received Vancomycin x 1 dose last evening.  Goal of Therapy:  Vancomycin trough level 15-20 mcg/ml  Plan:  Measure antibiotic drug levels at steady state Follow up culture results Vancomycin 1250mg  IV every  24 hours. Adjust Cefepime to 2gm IV every 24 hours.  Mady Gemma 06/17/2011,8:26 AM

## 2011-06-17 NOTE — Progress Notes (Signed)
Dr. Orvan Falconer aware of patient temp 101.5 and 650 Tylenol was given. No further meds for pain to be administered due to delirium.

## 2011-06-18 ENCOUNTER — Other Ambulatory Visit (HOSPITAL_COMMUNITY): Payer: Medicare Other

## 2011-06-18 ENCOUNTER — Encounter (HOSPITAL_COMMUNITY): Payer: Self-pay | Admitting: *Deleted

## 2011-06-18 ENCOUNTER — Inpatient Hospital Stay (HOSPITAL_COMMUNITY): Payer: Medicare Other

## 2011-06-18 LAB — CBC
HCT: 32.6 % — ABNORMAL LOW (ref 39.0–52.0)
Hemoglobin: 11 g/dL — ABNORMAL LOW (ref 13.0–17.0)
MCH: 31.6 pg (ref 26.0–34.0)
MCHC: 33.7 g/dL (ref 30.0–36.0)
RBC: 3.48 MIL/uL — ABNORMAL LOW (ref 4.22–5.81)

## 2011-06-18 LAB — BASIC METABOLIC PANEL
BUN: 27 mg/dL — ABNORMAL HIGH (ref 6–23)
CO2: 17 mEq/L — ABNORMAL LOW (ref 19–32)
Calcium: 8.1 mg/dL — ABNORMAL LOW (ref 8.4–10.5)
GFR calc non Af Amer: 46 mL/min — ABNORMAL LOW (ref >90)
Glucose, Bld: 93 mg/dL (ref 70–99)
Potassium: 3.5 mEq/L (ref 3.5–5.1)
Sodium: 131 mEq/L — ABNORMAL LOW (ref 135–145)

## 2011-06-18 LAB — URINE CULTURE: Culture: NO GROWTH

## 2011-06-18 LAB — STREP PNEUMONIAE URINARY ANTIGEN: Strep Pneumo Urinary Antigen: NEGATIVE

## 2011-06-18 MED ORDER — METOPROLOL TARTRATE 25 MG PO TABS
25.0000 mg | ORAL_TABLET | Freq: Two times a day (BID) | ORAL | Status: DC
  Administered 2011-06-18 – 2011-06-22 (×8): 25 mg via ORAL
  Filled 2011-06-18 (×8): qty 1

## 2011-06-18 MED ORDER — BIOTENE DRY MOUTH MT LIQD
15.0000 mL | Freq: Two times a day (BID) | OROMUCOSAL | Status: DC
Start: 2011-06-19 — End: 2011-06-22
  Administered 2011-06-19 – 2011-06-22 (×7): 15 mL via OROMUCOSAL

## 2011-06-18 MED ORDER — POTASSIUM CHLORIDE CRYS ER 20 MEQ PO TBCR
40.0000 meq | EXTENDED_RELEASE_TABLET | Freq: Every day | ORAL | Status: DC
  Administered 2011-06-18 – 2011-06-22 (×5): 40 meq via ORAL
  Filled 2011-06-18: qty 1
  Filled 2011-06-18 (×3): qty 2
  Filled 2011-06-18 (×2): qty 1

## 2011-06-18 MED ORDER — FUROSEMIDE 10 MG/ML IJ SOLN
40.0000 mg | Freq: Every day | INTRAMUSCULAR | Status: DC
  Administered 2011-06-18 – 2011-06-19 (×2): 40 mg via INTRAVENOUS
  Filled 2011-06-18 (×2): qty 4

## 2011-06-18 MED ORDER — SODIUM CHLORIDE 0.9 % IJ SOLN
INTRAMUSCULAR | Status: AC
  Administered 2011-06-18: 10 mL
  Filled 2011-06-18: qty 3

## 2011-06-18 NOTE — Progress Notes (Signed)
Subjective: Family reports that patient is starting to recognize them. He remains short of breath and coughing.  Family reports that he has been having diarrhea  Objective: Vital signs in last 24 hours: Temp:  [97.3 F (36.3 C)-100.6 F (38.1 C)] 97.3 F (36.3 C) (04/06 1027) Pulse Rate:  [70-102] 100  (04/06 1027) Resp:  [20] 20  (04/06 1027) BP: (112-142)/(62-69) 142/69 mmHg (04/06 1027) SpO2:  [94 %-98 %] 96 % (04/06 1027) Weight:  [89.6 kg (197 lb 8.5 oz)] 89.6 kg (197 lb 8.5 oz) (04/06 0400) Weight change: 4.2 kg (9 lb 4.1 oz) Last BM Date: 06/17/11  Intake/Output from previous day: 04/05 0701 - 04/06 0700 In: 4353 [P.O.:1260; I.V.:2793; IV Piggyback:300] Out: 1150 [Urine:1150] Total I/O In: 200 [P.O.:200] Out: -    Physical Exam: General: Alert, awake, increased wob HEENT: No bruits, no goiter. Heart: Regular rate and rhythm, without murmurs, rubs, gallops. Lungs: b/l rhohchi and wheezes Abdomen: Soft, nontender, nondistended, positive bowel sounds. Extremities: 1+ edema b/l Neuro: Grossly intact, nonfocal.    Lab Results: Basic Metabolic Panel:  Basename 06/18/11 0417 06/17/11 0541  NA 131* 130*  K 3.5 3.7  CL 103 99  CO2 17* 20  GLUCOSE 93 96  BUN 27* 23  CREATININE 1.32 1.48*  CALCIUM 8.1* 8.1*  MG -- --  PHOS -- --   Liver Function Tests:  Basename 06/17/11 0541  AST 27  ALT 14  ALKPHOS 44  BILITOT 1.3*  PROT 5.9*  ALBUMIN 2.7*   No results found for this basename: LIPASE:2,AMYLASE:2 in the last 72 hours No results found for this basename: AMMONIA:2 in the last 72 hours CBC:  Basename 06/18/11 0417 06/17/11 0541 06/16/11 2021  WBC 11.7* 9.0 --  NEUTROABS -- -- 4.1  HGB 11.0* 11.3* --  HCT 32.6* 33.1* --  MCV 93.7 94.3 --  PLT 144* 131* --   Cardiac Enzymes:  Basename 06/16/11 2021  CKTOTAL --  CKMB --  CKMBINDEX --  TROPONINI <0.30   BNP:  Basename 06/17/11 0541  PROBNP 7908.0*   D-Dimer: No results found for this  basename: DDIMER:2 in the last 72 hours CBG: No results found for this basename: GLUCAP:6 in the last 72 hours Hemoglobin A1C: No results found for this basename: HGBA1C in the last 72 hours Fasting Lipid Panel: No results found for this basename: CHOL,HDL,LDLCALC,TRIG,CHOLHDL,LDLDIRECT in the last 72 hours Thyroid Function Tests: No results found for this basename: TSH,T4TOTAL,FREET4,T3FREE,THYROIDAB in the last 72 hours Anemia Panel: No results found for this basename: VITAMINB12,FOLATE,FERRITIN,TIBC,IRON,RETICCTPCT in the last 72 hours Coagulation: No results found for this basename: LABPROT:2,INR:2 in the last 72 hours Urine Drug Screen: Drugs of Abuse  No results found for this basename: labopia, cocainscrnur, labbenz, amphetmu, thcu, labbarb    Alcohol Level: No results found for this basename: ETH:2 in the last 72 hours Urinalysis:  Basename 06/16/11 2046  COLORURINE YELLOW  LABSPEC 1.025  PHURINE 5.0  GLUCOSEU NEGATIVE  HGBUR MODERATE*  BILIRUBINUR NEGATIVE  KETONESUR NEGATIVE  PROTEINUR 30*  UROBILINOGEN 0.2  NITRITE NEGATIVE  LEUKOCYTESUR NEGATIVE    Recent Results (from the past 240 hour(s))  CULTURE, BLOOD (ROUTINE X 2)     Status: Normal (Preliminary result)   Collection Time   06/16/11  8:26 PM      Component Value Range Status Comment   Specimen Description BLOOD LEFT HAND   Final    Special Requests BOTTLES DRAWN AEROBIC AND ANAEROBIC 5CC   Final    Culture NO  GROWTH 1 DAY   Final    Report Status PENDING   Incomplete   CULTURE, BLOOD (ROUTINE X 2)     Status: Normal (Preliminary result)   Collection Time   06/16/11  8:43 PM      Component Value Range Status Comment   Specimen Description LEFT ANTECUBITAL   Final    Special Requests BOTTLES DRAWN AEROBIC AND ANAEROBIC 5CC   Final    Culture NO GROWTH 1 DAY   Final    Report Status PENDING   Incomplete   CULTURE, SPUTUM-ASSESSMENT     Status: Normal (Preliminary result)   Collection Time   06/17/11 10:34  AM      Component Value Range Status Comment   Specimen Description SPUTUM   Final    Special Requests NONE   Final    Sputum evaluation     Final    Value: THIS SPECIMEN IS ACCEPTABLE. RESPIRATORY CULTURE REPORT TO FOLLOW.   Report Status PENDING   Incomplete   CULTURE, RESPIRATORY     Status: Normal (Preliminary result)   Collection Time   06/17/11 10:35 AM      Component Value Range Status Comment   Specimen Description SPUTUM   Final    Special Requests NONE   Final    Gram Stain     Final    Value: NO WBC SEEN     RARE SQUAMOUS EPITHELIAL CELLS PRESENT     NO ORGANISMS SEEN   Culture PENDING   Incomplete    Report Status PENDING   Incomplete     Studies/Results: Dg Chest Port 1 View  06/17/2011  *RADIOLOGY REPORT*  Clinical Data: Pneumonia  PORTABLE CHEST - 1 VIEW  Comparison: 06/16/2011  Findings: Patchy right lower lobe opacity, new, suspicious for pneumonia.  Left basilar opacity, possibly atelectasis.  Overlying extensive calcified pleural plaques.  No pneumothorax.  The heart is top normal in size.  IMPRESSION: Patchy right lower lobe opacity, new, suspicious for pneumonia. Left basilar opacity, possibly atelectasis.  Extensive calcified pleural plaques.  Original Report Authenticated By: Charline Bills, M.D.   Dg Chest Port 1 View  06/16/2011  *RADIOLOGY REPORT*  Clinical Data: Cough, shortness of breath, hypoxia, fever, left lower lobe rales.  PORTABLE CHEST - 1 VIEW  Comparison: 06/14/2011  Findings: Cardiac enlargement with normal pulmonary vascularity. Calcified pleural plaques and pleural thickening diffusely and bilaterally, stable since prior study.  No definite evidence of any focal airspace consolidation.  No blunting of costophrenic angles. No pneumothorax.  Calcification of the aorta.  Prominent soft tissue in the right paratracheal region may represent vascular shadow or thyroid goiter.  Degenerative changes in the shoulders.  IMPRESSION: Cardiac enlargement.  Diffuse  bilateral calcified pleural plaques and pleural thickening.  Stable appearance since previous study.  Original Report Authenticated By: Marlon Pel, M.D.    Medications: Scheduled Meds:   . acetaminophen      . amLODipine  5 mg Oral Daily  . antiseptic oral rinse  15 mL Mouth Rinse BID  . bimatoprost  1 drop Both Eyes QHS  . ceFEPime (MAXIPIME) IV  2 g Intravenous Q24H  . dipyridamole-aspirin  1 capsule Oral BID  . enoxaparin  30 mg Subcutaneous Daily  . fluticasone  2 spray Each Nare Daily  . furosemide  40 mg Intravenous Daily  . guaiFENesin  1,200 mg Oral BID  . metoprolol tartrate  12.5 mg Oral BID  . sodium chloride  2 spray Nasal BID  .  Tamsulosin HCl  0.4 mg Oral Daily  . vancomycin  1,250 mg Intravenous Q24H   Continuous Infusions:   . DISCONTD: 0.9 % NaCl with KCl 20 mEq / L 75 mL/hr at 06/18/11 0536   PRN Meds:.albuterol, ALPRAZolam, ipratropium  Assessment/Plan:  Active Problems:  Dehydration  Healthcare-associated pneumonia  Delirium due to general medical condition  CHF, chronic  BPH (benign prostatic hyperplasia)  Acute respiratory failure  Plan:  #1 healthcare acquired pneumonia. Repeat chest x-ray shows developing right lower lobe pneumonia. We'll continue broad-spectrum antibiotics as patient is still febrile.  #2 acute on chronic diastolic congestive heart failure. Patient is showing signs of volume overload. We will discontinue IV fluids and start low-dose Lasix.  #3. Diarrhea. Possibly secondary to antibiotics since patient is febrile we will check stool C. difficile.  #4. Metabolic encephalopathy. Likely secondary to underlying infection, slowly improving.  #5. Metabolic acidosis, likely secondary to diarrhea. We'll continue to follow.  #6. Nonsustained V. tach on telemetry. Check magnesium. Replace potassium. Increase beta blockade.   LOS: 2 days   Linkon Siverson [facility] Pager: [phone] 06/18/2011, 12:01 PM

## 2011-06-19 DIAGNOSIS — J101 Influenza due to other identified influenza virus with other respiratory manifestations: Secondary | ICD-10-CM | POA: Diagnosis present

## 2011-06-19 LAB — CBC
Hemoglobin: 11.2 g/dL — ABNORMAL LOW (ref 13.0–17.0)
MCH: 31.9 pg (ref 26.0–34.0)
MCV: 93.7 fL (ref 78.0–100.0)
RBC: 3.51 MIL/uL — ABNORMAL LOW (ref 4.22–5.81)

## 2011-06-19 LAB — LEGIONELLA ANTIGEN, URINE

## 2011-06-19 LAB — BASIC METABOLIC PANEL
CO2: 19 mEq/L (ref 19–32)
Calcium: 8.5 mg/dL (ref 8.4–10.5)
Glucose, Bld: 103 mg/dL — ABNORMAL HIGH (ref 70–99)
Potassium: 3.5 mEq/L (ref 3.5–5.1)
Sodium: 135 mEq/L (ref 135–145)

## 2011-06-19 MED ORDER — SODIUM CHLORIDE 0.9 % IJ SOLN
INTRAMUSCULAR | Status: AC
  Administered 2011-06-19: 10 mL
  Filled 2011-06-19: qty 3

## 2011-06-19 MED ORDER — OSELTAMIVIR PHOSPHATE 75 MG PO CAPS
75.0000 mg | ORAL_CAPSULE | Freq: Two times a day (BID) | ORAL | Status: DC
  Administered 2011-06-19 – 2011-06-22 (×7): 75 mg via ORAL
  Filled 2011-06-19 (×6): qty 1

## 2011-06-19 MED ORDER — ENOXAPARIN SODIUM 40 MG/0.4ML ~~LOC~~ SOLN
40.0000 mg | Freq: Every day | SUBCUTANEOUS | Status: DC
  Administered 2011-06-19: 30 mg via SUBCUTANEOUS
  Administered 2011-06-20 – 2011-06-22 (×3): 40 mg via SUBCUTANEOUS
  Filled 2011-06-19 (×3): qty 0.4

## 2011-06-19 MED ORDER — ACETAMINOPHEN 325 MG PO TABS
ORAL_TABLET | ORAL | Status: AC
  Administered 2011-06-19: 19:00:00
  Filled 2011-06-19: qty 1

## 2011-06-19 MED ORDER — FUROSEMIDE 10 MG/ML IJ SOLN
40.0000 mg | Freq: Two times a day (BID) | INTRAMUSCULAR | Status: DC
  Administered 2011-06-20: 40 mg via INTRAVENOUS
  Filled 2011-06-19: qty 4

## 2011-06-19 NOTE — Progress Notes (Signed)
Subjective: Patient is awake, able to have a conversation, has some confusion but report some shortness of breath.  Family reports that he is coughing less today  Objective: Vital signs in last 24 hours: Temp:  [97.6 F (36.4 C)-99.8 F (37.7 C)] 99.8 F (37.7 C) (04/07 1011) Pulse Rate:  [82-92] 86  (04/07 1011) Resp:  [20-21] 20  (04/07 1011) BP: (132-167)/(63-81) 167/65 mmHg (04/07 1011) SpO2:  [93 %-97 %] 94 % (04/07 1011) Weight:  [87.2 kg (192 lb 3.9 oz)] 87.2 kg (192 lb 3.9 oz) (04/07 0244) Weight change: -2.4 kg (-5 lb 4.7 oz) Last BM Date: 06/18/11  Intake/Output from previous day: 04/06 0701 - 04/07 0700 In: 1130 [P.O.:820; I.V.:10; IV Piggyback:300] Out: 3600 [Urine:3600] Total I/O In: 120 [P.O.:120] Out: 300 [Urine:300]   Physical Exam: General: Alert, awake, in no acute distress. HEENT: No bruits, no goiter. Heart: Regular rate and rhythm, without murmurs, rubs, gallops. Lungs: b/l wheezes improved from yesterday. Abdomen: Soft, nontender, nondistended, positive bowel sounds. Extremities: mild edema b/l Neuro: Grossly intact, nonfocal.    Lab Results: Basic Metabolic Panel:  Basename 06/19/11 0424 06/18/11 0417  NA 135 131*  K 3.5 3.5  CL 104 103  CO2 19 17*  GLUCOSE 103* 93  BUN 25* 27*  CREATININE 1.14 1.32  CALCIUM 8.5 8.1*  MG -- 1.8  PHOS -- --   Liver Function Tests:  Basename 06/17/11 0541  AST 27  ALT 14  ALKPHOS 44  BILITOT 1.3*  PROT 5.9*  ALBUMIN 2.7*   No results found for this basename: LIPASE:2,AMYLASE:2 in the last 72 hours No results found for this basename: AMMONIA:2 in the last 72 hours CBC:  Basename 06/19/11 0424 06/18/11 0417 06/16/11 2021  WBC 9.9 11.7* --  NEUTROABS -- -- 4.1  HGB 11.2* 11.0* --  HCT 32.9* 32.6* --  MCV 93.7 93.7 --  PLT 160 144* --   Cardiac Enzymes:  Basename 06/16/11 2021  CKTOTAL --  CKMB --  CKMBINDEX --  TROPONINI <0.30   BNP:  Basename 06/17/11 0541  PROBNP 7908.0*    D-Dimer: No results found for this basename: DDIMER:2 in the last 72 hours CBG: No results found for this basename: GLUCAP:6 in the last 72 hours Hemoglobin A1C: No results found for this basename: HGBA1C in the last 72 hours Fasting Lipid Panel: No results found for this basename: CHOL,HDL,LDLCALC,TRIG,CHOLHDL,LDLDIRECT in the last 72 hours Thyroid Function Tests: No results found for this basename: TSH,T4TOTAL,FREET4,T3FREE,THYROIDAB in the last 72 hours Anemia Panel: No results found for this basename: VITAMINB12,FOLATE,FERRITIN,TIBC,IRON,RETICCTPCT in the last 72 hours Coagulation: No results found for this basename: LABPROT:2,INR:2 in the last 72 hours Urine Drug Screen: Drugs of Abuse  No results found for this basename: labopia,  cocainscrnur,  labbenz,  amphetmu,  thcu,  labbarb    Alcohol Level: No results found for this basename: ETH:2 in the last 72 hours Urinalysis:  Basename 06/16/11 2046  COLORURINE YELLOW  LABSPEC 1.025  PHURINE 5.0  GLUCOSEU NEGATIVE  HGBUR MODERATE*  BILIRUBINUR NEGATIVE  KETONESUR NEGATIVE  PROTEINUR 30*  UROBILINOGEN 0.2  NITRITE NEGATIVE  LEUKOCYTESUR NEGATIVE    Recent Results (from the past 240 hour(s))  CULTURE, BLOOD (ROUTINE X 2)     Status: Normal (Preliminary result)   Collection Time   06/16/11  8:26 PM      Component Value Range Status Comment   Specimen Description BLOOD LEFT HAND   Final    Special Requests BOTTLES DRAWN AEROBIC AND ANAEROBIC  5CC   Final    Culture NO GROWTH 1 DAY   Final    Report Status PENDING   Incomplete   CULTURE, BLOOD (ROUTINE X 2)     Status: Normal (Preliminary result)   Collection Time   06/16/11  8:43 PM      Component Value Range Status Comment   Specimen Description LEFT ANTECUBITAL   Final    Special Requests BOTTLES DRAWN AEROBIC AND ANAEROBIC 5CC   Final    Culture NO GROWTH 1 DAY   Final    Report Status PENDING   Incomplete   URINE CULTURE     Status: Normal   Collection Time    06/16/11  8:46 PM      Component Value Range Status Comment   Specimen Description URINE, CATHETERIZED   Final    Special Requests NONE   Final    Culture  Setup Time [id number]   Final    Colony Count NO GROWTH   Final    Culture NO GROWTH   Final    Report Status 06/18/2011 FINAL   Final   CULTURE, SPUTUM-ASSESSMENT     Status: Normal (Preliminary result)   Collection Time   06/17/11 10:34 AM      Component Value Range Status Comment   Specimen Description SPUTUM   Final    Special Requests NONE   Final    Sputum evaluation     Final    Value: THIS SPECIMEN IS ACCEPTABLE. RESPIRATORY CULTURE REPORT TO FOLLOW.   Report Status PENDING   Incomplete   CULTURE, RESPIRATORY     Status: Normal (Preliminary result)   Collection Time   06/17/11 10:35 AM      Component Value Range Status Comment   Specimen Description SPUTUM   Final    Special Requests NONE   Final    Gram Stain     Final    Value: NO WBC SEEN     RARE SQUAMOUS EPITHELIAL CELLS PRESENT     NO ORGANISMS SEEN   Culture PENDING   Incomplete    Report Status PENDING   Incomplete   RESPIRATORY VIRUS PANEL (18 COMPONENTS)     Status: Abnormal (Preliminary result)   Collection Time   06/17/11  3:11 PM      Component Value Range Status Comment   Source - RVPAN NOSE   Final    Respiratory Syncytial Virus A NOT DETECTED   Final    Respiratory Syncytial Virus B NOT DETECTED   Final    Influenza A NOT DETECTED   Final    Influenza B DETECTED (*)  Final    Parainfluenza 1 NOT DETECTED   Final    Parainfluenza 2 NOT DETECTED   Final    Parainfluenza 3 NOT DETECTED   Final    Parainfluenza 4 NOT DETECTED   Final    Metapneumovirus NOT DETECTED   Final    Coxsackie and Echovirus NOT DETECTED   Final    Rhinovirus NOT DETECTED   Final    Adenovirus B NOT DETECTED   Final    Adenovirus E NOT DETECTED   Final    CoronavirusNL63 NOT DETECTED   Final    CoronavirusHKU1 NOT DETECTED   Final    Coronavirus229E NOT DETECTED   Final     CoronavirusOC43 NOT DETECTED   Final    Bocavirus PENDING   Incomplete   CLOSTRIDIUM DIFFICILE BY PCR     Status: Normal  Collection Time   06/19/11  1:58 AM      Component Value Range Status Comment   C difficile by pcr NEGATIVE  NEGATIVE  Final     Studies/Results: Dg Chest Port 1 View  06/17/2011  *RADIOLOGY REPORT*  Clinical Data: Pneumonia  PORTABLE CHEST - 1 VIEW  Comparison: 06/16/2011  Findings: Patchy right lower lobe opacity, new, suspicious for pneumonia.  Left basilar opacity, possibly atelectasis.  Overlying extensive calcified pleural plaques.  No pneumothorax.  The heart is top normal in size.  IMPRESSION: Patchy right lower lobe opacity, new, suspicious for pneumonia. Left basilar opacity, possibly atelectasis.  Extensive calcified pleural plaques.  Original Report Authenticated By: Charline Bills, M.D.    Medications: Scheduled Meds:    . acetaminophen      . amLODipine  5 mg Oral Daily  . antiseptic oral rinse  15 mL Mouth Rinse BID  . bimatoprost  1 drop Both Eyes QHS  . ceFEPime (MAXIPIME) IV  2 g Intravenous Q24H  . dipyridamole-aspirin  1 capsule Oral BID  . enoxaparin  40 mg Subcutaneous Daily  . fluticasone  2 spray Each Nare Daily  . furosemide  40 mg Intravenous Daily  . guaiFENesin  1,200 mg Oral BID  . metoprolol tartrate  25 mg Oral BID  . potassium chloride  40 mEq Oral Daily  . sodium chloride  2 spray Nasal BID  . sodium chloride      . Tamsulosin HCl  0.4 mg Oral Daily  . vancomycin  1,250 mg Intravenous Q24H  . DISCONTD: enoxaparin  30 mg Subcutaneous Daily  . DISCONTD: metoprolol tartrate  12.5 mg Oral BID   Continuous Infusions:  PRN Meds:.albuterol, ALPRAZolam, ipratropium  Assessment/Plan:  Active Problems:  Dehydration  Healthcare-associated pneumonia  Delirium due to general medical condition  CHF, chronic  BPH (benign prostatic hyperplasia)  Acute respiratory failure  Influenza B  Plan:  This gentleman was admitted to the  hospital with shortness of breath, cough, fever and confusion.    #1. Metabolic encephalopathy, likely related to underlying infections, continue to follow.  #2. HCAP.  On broad spectrum antibiotics. Currently a febrile, continue abx for now  #3. Influenza B.  Start tamiflu,droplet isolation.  #4. Acute on chronic diastolic CHF.  Patient started on IV lasix.  His wheezing has improved. Change lasix to bid and continue to monitor.  #5. Leukocytosis, resolved.  #6. Diarrhea, family now says it has been a chronic issue.  C diff is negative.  Use imodium prn  #7. Dispo, return to snf once improved   LOS: 3 days   Ugochi Henzler [facility] Pager: [phone] 06/19/2011, 11:54 AM

## 2011-06-20 LAB — BASIC METABOLIC PANEL
BUN: 22 mg/dL (ref 6–23)
CO2: 22 mEq/L (ref 19–32)
Chloride: 106 mEq/L (ref 96–112)
Creatinine, Ser: 1.02 mg/dL (ref 0.50–1.35)

## 2011-06-20 LAB — CBC
HCT: 34.2 % — ABNORMAL LOW (ref 39.0–52.0)
MCHC: 33.3 g/dL (ref 30.0–36.0)
MCV: 95 fL (ref 78.0–100.0)
RDW: 14.3 % (ref 11.5–15.5)

## 2011-06-20 LAB — CULTURE, RESPIRATORY W GRAM STAIN
Culture: NORMAL
Gram Stain: NONE SEEN

## 2011-06-20 MED ORDER — VANCOMYCIN HCL 1000 MG IV SOLR
750.0000 mg | Freq: Two times a day (BID) | INTRAVENOUS | Status: DC
  Administered 2011-06-20 – 2011-06-21 (×4): 750 mg via INTRAVENOUS
  Filled 2011-06-20 (×8): qty 750

## 2011-06-20 MED ORDER — DEXTROSE 5 % IV SOLN
1.0000 g | Freq: Two times a day (BID) | INTRAVENOUS | Status: DC
  Administered 2011-06-20 – 2011-06-22 (×4): 1 g via INTRAVENOUS
  Filled 2011-06-20 (×6): qty 1

## 2011-06-20 MED ORDER — FUROSEMIDE 40 MG PO TABS
40.0000 mg | ORAL_TABLET | Freq: Every day | ORAL | Status: DC
  Administered 2011-06-21 – 2011-06-22 (×2): 40 mg via ORAL
  Filled 2011-06-20 (×2): qty 1

## 2011-06-20 MED ORDER — SODIUM CHLORIDE 0.9 % IJ SOLN
INTRAMUSCULAR | Status: AC
  Administered 2011-06-20: 09:00:00
  Filled 2011-06-20: qty 3

## 2011-06-20 NOTE — Progress Notes (Signed)
ANTIBIOTIC CONSULT NOTE  Pharmacy Consult for Vancomycin - Renal Dosing Antibiotics Indication: pneumonia (HCAP)  Allergies  Allergen Reactions  . Ciprofloxacin   . Penicillins   . Tetracyclines & Related   . Tramadol     Patient Measurements: Height: 5\' 11"  (180.3 cm) Weight: 187 lb 6.3 oz (85 kg) IBW/kg (Calculated) : 75.3   Vital Signs: Temp: 98.2 F (36.8 C) (04/08 0358) Temp src: Oral (04/08 0358) BP: 135/75 mmHg (04/08 0358) Pulse Rate: 85  (04/08 0358) Intake/Output from previous day: 04/07 0701 - 04/08 0700 In: 1410 [P.O.:1100; I.V.:10; IV Piggyback:300] Out: 2250 [Urine:2250] Intake/Output from this shift:    Labs:  Basename 06/20/11 0450 06/19/11 0424 06/18/11 0417  WBC 8.5 9.9 11.7*  HGB 11.4* 11.2* 11.0*  PLT 183 160 144*  LABCREA -- -- --  CREATININE 1.02 1.14 1.32   Estimated Creatinine Clearance: 51.3 ml/min (by C-G formula based on Cr of 1.02).  Basename 06/19/11 1836  VANCOTROUGH 9.7*  VANCOPEAK --  Drue Dun --  GENTTROUGH --  GENTPEAK --  GENTRANDOM --  TOBRATROUGH --  Nolen Mu --  TOBRARND --  AMIKACINPEAK --  AMIKACINTROU --  AMIKACIN --     Microbiology: Recent Results (from the past 720 hour(s))  CULTURE, BLOOD (ROUTINE X 2)     Status: Normal (Preliminary result)   Collection Time   06/16/11  8:26 PM      Component Value Range Status Comment   Specimen Description BLOOD LEFT HAND   Final    Special Requests BOTTLES DRAWN AEROBIC AND ANAEROBIC 5CC   Final    Culture NO GROWTH 1 DAY   Final    Report Status PENDING   Incomplete   CULTURE, BLOOD (ROUTINE X 2)     Status: Normal (Preliminary result)   Collection Time   06/16/11  8:43 PM      Component Value Range Status Comment   Specimen Description LEFT ANTECUBITAL   Final    Special Requests BOTTLES DRAWN AEROBIC AND ANAEROBIC 5CC   Final    Culture NO GROWTH 1 DAY   Final    Report Status PENDING   Incomplete   URINE CULTURE     Status: Normal   Collection Time   06/16/11  8:46 PM      Component Value Range Status Comment   Specimen Description URINE, CATHETERIZED   Final    Special Requests NONE   Final    Culture  Setup Time [id number]   Final    Colony Count NO GROWTH   Final    Culture NO GROWTH   Final    Report Status 06/18/2011 FINAL   Final   STOOL CULTURE     Status: Normal (Preliminary result)   Collection Time   06/17/11 10:33 AM      Component Value Range Status Comment   Specimen Description PERIRECTAL   Final    Special Requests NONE   Final    Culture NO SUSPICIOUS COLONIES, CONTINUING TO HOLD   Final    Report Status PENDING   Incomplete   CULTURE, SPUTUM-ASSESSMENT     Status: Normal (Preliminary result)   Collection Time   06/17/11 10:34 AM      Component Value Range Status Comment   Specimen Description SPUTUM   Final    Special Requests NONE   Final    Sputum evaluation     Final    Value: THIS SPECIMEN IS ACCEPTABLE. RESPIRATORY CULTURE REPORT TO FOLLOW.   Report  Status PENDING   Incomplete   CULTURE, RESPIRATORY     Status: Normal   Collection Time   06/17/11 10:35 AM      Component Value Range Status Comment   Specimen Description SPUTUM   Final    Special Requests NONE   Final    Gram Stain     Final    Value: NO WBC SEEN     RARE SQUAMOUS EPITHELIAL CELLS PRESENT     NO ORGANISMS SEEN   Culture NORMAL OROPHARYNGEAL FLORA   Final    Report Status 06/20/2011 FINAL   Final   RESPIRATORY VIRUS PANEL (18 COMPONENTS)     Status: Abnormal (Preliminary result)   Collection Time   06/17/11  3:11 PM      Component Value Range Status Comment   Source - RVPAN NOSE   Final    Respiratory Syncytial Virus A NOT DETECTED   Final    Respiratory Syncytial Virus B NOT DETECTED   Final    Influenza A NOT DETECTED   Final    Influenza B DETECTED (*)  Final    Parainfluenza 1 NOT DETECTED   Final    Parainfluenza 2 NOT DETECTED   Final    Parainfluenza 3 NOT DETECTED   Final    Parainfluenza 4 NOT DETECTED   Final     Metapneumovirus NOT DETECTED   Final    Coxsackie and Echovirus NOT DETECTED   Final    Rhinovirus NOT DETECTED   Final    Adenovirus B NOT DETECTED   Final    Adenovirus E NOT DETECTED   Final    CoronavirusNL63 NOT DETECTED   Final    CoronavirusHKU1 NOT DETECTED   Final    Coronavirus229E NOT DETECTED   Final    CoronavirusOC43 NOT DETECTED   Final    Bocavirus PENDING   Incomplete   CLOSTRIDIUM DIFFICILE BY PCR     Status: Normal   Collection Time   06/19/11  1:58 AM      Component Value Range Status Comment   C difficile by pcr NEGATIVE  NEGATIVE  Final     Medical History: Past Medical History  Diagnosis Date  . CHF (congestive heart failure)   . Glaucoma   . Hypertension   . Urinary retention   . Agitation   . Confusion   . DVT (deep venous thrombosis)   . BPH (benign prostatic hyperplasia)   . Delirium   . CVA (cerebral infarction)     Medications:  Scheduled:     . acetaminophen      . amLODipine  5 mg Oral Daily  . antiseptic oral rinse  15 mL Mouth Rinse BID  . bimatoprost  1 drop Both Eyes QHS  . ceFEPime (MAXIPIME) IV  2 g Intravenous Q24H  . dipyridamole-aspirin  1 capsule Oral BID  . enoxaparin  40 mg Subcutaneous Daily  . fluticasone  2 spray Each Nare Daily  . furosemide  40 mg Intravenous BID  . guaiFENesin  1,200 mg Oral BID  . metoprolol tartrate  25 mg Oral BID  . oseltamivir  75 mg Oral BID  . potassium chloride  40 mEq Oral Daily  . sodium chloride  2 spray Nasal BID  . sodium chloride      . sodium chloride      . Tamsulosin HCl  0.4 mg Oral Daily  . vancomycin  1,250 mg Intravenous Q24H  . DISCONTD: enoxaparin  30  mg Subcutaneous Daily  . DISCONTD: furosemide  40 mg Intravenous Daily   Assessment: Okay for Protocol. Renal function improved. Estimated Creatinine Clearance: 51.3 ml/min (by C-G formula based on Cr of 1.02). Trough below goal.  Goal of Therapy:  Vancomycin trough level 15-20 mcg/ml  Plan:  Measure antibiotic drug  levels at steady state Follow up culture results Change Vancomycin 750mg  IV every 12 hours. Adjust Cefepime to 1gm IV every 12 hours.  Anthony Leon 06/20/2011,9:58 AM

## 2011-06-20 NOTE — Progress Notes (Signed)
Patient admitted from [facility] and plans are in place for return at d/c. Patient currently receiving IV Vancomycin and can continue this at SNF if needed-Anthony Leon [place], MSW, [place] [phone]

## 2011-06-20 NOTE — Progress Notes (Deleted)
SNF search is underway- FL2 has been faxed out to area SNF's and awaiting responses/bed offers- Will advise and anticipate offers at time patient is ready for d/c.  Kallan Merrick, MSW, LCSWA  [phone]  

## 2011-06-20 NOTE — Progress Notes (Signed)
Subjective: This man is somewhat some less and this morning. When aroused, he says he feels well. He denies any cough or dyspnea.           Physical Exam: Blood pressure 132/77, pulse 81, temperature 97.4 F (36.3 C), temperature source Oral, resp. rate 20, height 5\' 11"  (1.803 m), weight 85 kg (187 lb 6.3 oz), SpO2 95.00%. He looks systemically well. Is not toxic or septic. Lung fields are actually clear with possibly a few crackles at the right mid and lower zones. Otherwise there is no bronchial breathing. Heart sounds are present and normal. There are no obvious focal neurological signs.   Investigations:  Recent Results (from the past 240 hour(s))  CULTURE, BLOOD (ROUTINE X 2)     Status: Normal (Preliminary result)   Collection Time   06/16/11  8:26 PM      Component Value Range Status Comment   Specimen Description BLOOD LEFT HAND   Final    Special Requests BOTTLES DRAWN AEROBIC AND ANAEROBIC 5CC   Final    Culture NO GROWTH 1 DAY   Final    Report Status PENDING   Incomplete   CULTURE, BLOOD (ROUTINE X 2)     Status: Normal (Preliminary result)   Collection Time   06/16/11  8:43 PM      Component Value Range Status Comment   Specimen Description LEFT ANTECUBITAL   Final    Special Requests BOTTLES DRAWN AEROBIC AND ANAEROBIC 5CC   Final    Culture NO GROWTH 1 DAY   Final    Report Status PENDING   Incomplete   URINE CULTURE     Status: Normal   Collection Time   06/16/11  8:46 PM      Component Value Range Status Comment   Specimen Description URINE, CATHETERIZED   Final    Special Requests NONE   Final    Culture  Setup Time [id number]   Final    Colony Count NO GROWTH   Final    Culture NO GROWTH   Final    Report Status 06/18/2011 FINAL   Final   STOOL CULTURE     Status: Normal (Preliminary result)   Collection Time   06/17/11 10:33 AM      Component Value Range Status Comment   Specimen Description PERIRECTAL   Final    Special Requests NONE   Final     Culture NO SUSPICIOUS COLONIES, CONTINUING TO HOLD   Final    Report Status PENDING   Incomplete   CULTURE, SPUTUM-ASSESSMENT     Status: Normal (Preliminary result)   Collection Time   06/17/11 10:34 AM      Component Value Range Status Comment   Specimen Description SPUTUM   Final    Special Requests NONE   Final    Sputum evaluation     Final    Value: THIS SPECIMEN IS ACCEPTABLE. RESPIRATORY CULTURE REPORT TO FOLLOW.   Report Status PENDING   Incomplete   CULTURE, RESPIRATORY     Status: Normal   Collection Time   06/17/11 10:35 AM      Component Value Range Status Comment   Specimen Description SPUTUM   Final    Special Requests NONE   Final    Gram Stain     Final    Value: NO WBC SEEN     RARE SQUAMOUS EPITHELIAL CELLS PRESENT     NO ORGANISMS SEEN   Culture NORMAL OROPHARYNGEAL FLORA  Final    Report Status 06/20/2011 FINAL   Final   RESPIRATORY VIRUS PANEL (18 COMPONENTS)     Status: Abnormal (Preliminary result)   Collection Time   06/17/11  3:11 PM      Component Value Range Status Comment   Source - RVPAN NOSE   Final    Respiratory Syncytial Virus A NOT DETECTED   Final    Respiratory Syncytial Virus B NOT DETECTED   Final    Influenza A NOT DETECTED   Final    Influenza B DETECTED (*)  Final    Parainfluenza 1 NOT DETECTED   Final    Parainfluenza 2 NOT DETECTED   Final    Parainfluenza 3 NOT DETECTED   Final    Parainfluenza 4 NOT DETECTED   Final    Metapneumovirus NOT DETECTED   Final    Coxsackie and Echovirus NOT DETECTED   Final    Rhinovirus NOT DETECTED   Final    Adenovirus B NOT DETECTED   Final    Adenovirus E NOT DETECTED   Final    CoronavirusNL63 NOT DETECTED   Final    CoronavirusHKU1 NOT DETECTED   Final    Coronavirus229E NOT DETECTED   Final    CoronavirusOC43 NOT DETECTED   Final    Bocavirus PENDING   Incomplete   CLOSTRIDIUM DIFFICILE BY PCR     Status: Normal   Collection Time   06/19/11  1:58 AM      Component Value Range Status Comment    C difficile by pcr NEGATIVE  NEGATIVE  Final      Basic Metabolic Panel:  Basename 06/20/11 0450 06/19/11 0424 06/18/11 0417  NA 137 135 --  K 3.9 3.5 --  CL 106 104 --  CO2 22 19 --  GLUCOSE 102* 103* --  BUN 22 25* --  CREATININE 1.02 1.14 --  CALCIUM 8.7 8.5 --  MG -- -- 1.8  PHOS -- -- --       CBC:  Basename 06/20/11 0450 06/19/11 0424  WBC 8.5 9.9  NEUTROABS -- --  HGB 11.4* 11.2*  HCT 34.2* 32.9*  MCV 95.0 93.7  PLT 183 160        Medications: I have reviewed the patient's current medications.  Impression: 1. Right-sided pneumonia, healthcare associated. 2. Influenza B. 3. Acute on chronic congestive heart failure, clinically compensated.     Plan: 1. Discontinue Foley catheter. 2. Change IV Lasix to oral Lasix. 3. Mobilize. 4. Disposition-return to skilled nursing facility soon if he continues to improve.     LOS: 4 days   Wilson Singer Pager [phone]  06/20/2011, 11:45 AM

## 2011-06-20 NOTE — Plan of Care (Signed)
Problem: Phase I Progression Outcomes Goal: Pain controlled with appropriate interventions Outcome: Completed/Met Date Met:  06/20/11 No complaints of pain today.

## 2011-06-21 ENCOUNTER — Inpatient Hospital Stay (HOSPITAL_COMMUNITY): Payer: Medicare Other

## 2011-06-21 LAB — COMPREHENSIVE METABOLIC PANEL
ALT: 11 U/L (ref 0–53)
Calcium: 8.7 mg/dL (ref 8.4–10.5)
Creatinine, Ser: 0.95 mg/dL (ref 0.50–1.35)
GFR calc Af Amer: 82 mL/min — ABNORMAL LOW (ref >90)
Glucose, Bld: 92 mg/dL (ref 70–99)
Sodium: 136 mEq/L (ref 135–145)
Total Protein: 6.4 g/dL (ref 6.0–8.3)

## 2011-06-21 LAB — CBC
Hemoglobin: 11.4 g/dL — ABNORMAL LOW (ref 13.0–17.0)
MCH: 31.7 pg (ref 26.0–34.0)
MCHC: 33.3 g/dL (ref 30.0–36.0)
Platelets: 201 10*3/uL (ref 150–400)

## 2011-06-21 LAB — CULTURE, BLOOD (ROUTINE X 2)
Culture: NO GROWTH
Culture: NO GROWTH

## 2011-06-21 LAB — STOOL CULTURE

## 2011-06-21 NOTE — Progress Notes (Signed)
Notified [facility] that patient may be ready for return to SNF tomorrow- will follow up in morning- Reece Levy, MSW, [place] [phone]

## 2011-06-21 NOTE — Progress Notes (Signed)
Subjective: This man is more alert today. He says he feels well. He has good oral intake. There is no significant cough or dyspnea.           Physical Exam: Blood pressure 156/71, pulse 70, temperature 97.6 F (36.4 C), temperature source Oral, resp. rate 18, height 5\' 11"  (1.803 m), weight 85 kg (187 lb 6.3 oz), SpO2 90.00%. He looks systemically well. Is not toxic or septic. Lung fields are actually clear with possibly a few crackles at the right mid and lower zones. Otherwise there is no bronchial breathing. Heart sounds are present and normal. There are no obvious focal neurological signs.   Investigations:  Recent Results (from the past 240 hour(s))  CULTURE, BLOOD (ROUTINE X 2)     Status: Normal (Preliminary result)   Collection Time   06/16/11  8:26 PM      Component Value Range Status Comment   Specimen Description BLOOD LEFT HAND   Final    Special Requests BOTTLES DRAWN AEROBIC AND ANAEROBIC 5CC   Final    Culture NO GROWTH 4 DAYS   Final    Report Status PENDING   Incomplete   CULTURE, BLOOD (ROUTINE X 2)     Status: Normal (Preliminary result)   Collection Time   06/16/11  8:43 PM      Component Value Range Status Comment   Specimen Description LEFT ANTECUBITAL   Final    Special Requests BOTTLES DRAWN AEROBIC AND ANAEROBIC 5CC   Final    Culture NO GROWTH 4 DAYS   Final    Report Status PENDING   Incomplete   URINE CULTURE     Status: Normal   Collection Time   06/16/11  8:46 PM      Component Value Range Status Comment   Specimen Description URINE, CATHETERIZED   Final    Special Requests NONE   Final    Culture  Setup Time [id number]   Final    Colony Count NO GROWTH   Final    Culture NO GROWTH   Final    Report Status 06/18/2011 FINAL   Final   STOOL CULTURE     Status: Normal   Collection Time   06/17/11 10:33 AM      Component Value Range Status Comment   Specimen Description STOOL   Final    Special Requests NONE   Final    Culture     Final    Value: NO SALMONELLA, SHIGELLA, CAMPYLOBACTER, OR YERSINIA ISOLATED   Report Status 06/21/2011 FINAL   Final   CULTURE, SPUTUM-ASSESSMENT     Status: Normal (Preliminary result)   Collection Time   06/17/11 10:34 AM      Component Value Range Status Comment   Specimen Description SPUTUM   Final    Special Requests NONE   Final    Sputum evaluation     Final    Value: THIS SPECIMEN IS ACCEPTABLE. RESPIRATORY CULTURE REPORT TO FOLLOW.   Report Status PENDING   Incomplete   CULTURE, RESPIRATORY     Status: Normal   Collection Time   06/17/11 10:35 AM      Component Value Range Status Comment   Specimen Description SPUTUM   Final    Special Requests NONE   Final    Gram Stain     Final    Value: NO WBC SEEN     RARE SQUAMOUS EPITHELIAL CELLS PRESENT     NO ORGANISMS SEEN  Culture NORMAL OROPHARYNGEAL FLORA   Final    Report Status 06/20/2011 FINAL   Final   RESPIRATORY VIRUS PANEL (18 COMPONENTS)     Status: Abnormal (Preliminary result)   Collection Time   06/17/11  3:11 PM      Component Value Range Status Comment   Source - RVPAN NOSE   Final    Respiratory Syncytial Virus A NOT DETECTED   Final    Respiratory Syncytial Virus B NOT DETECTED   Final    Influenza A NOT DETECTED   Final    Influenza B DETECTED (*)  Final    Parainfluenza 1 NOT DETECTED   Final    Parainfluenza 2 NOT DETECTED   Final    Parainfluenza 3 NOT DETECTED   Final    Parainfluenza 4 NOT DETECTED   Final    Metapneumovirus NOT DETECTED   Final    Coxsackie and Echovirus NOT DETECTED   Final    Rhinovirus NOT DETECTED   Final    Adenovirus B NOT DETECTED   Final    Adenovirus E NOT DETECTED   Final    CoronavirusNL63 NOT DETECTED   Final    CoronavirusHKU1 NOT DETECTED   Final    Coronavirus229E NOT DETECTED   Final    CoronavirusOC43 NOT DETECTED   Final    Bocavirus PENDING   Incomplete   CLOSTRIDIUM DIFFICILE BY PCR     Status: Normal   Collection Time   06/19/11  1:58 AM      Component Value Range  Status Comment   C difficile by pcr NEGATIVE  NEGATIVE  Final      Basic Metabolic Panel:  Basename 06/21/11 0609 06/20/11 0450  NA 136 137  K 3.7 3.9  CL 103 106  CO2 21 22  GLUCOSE 92 102*  BUN 24* 22  CREATININE 0.95 1.02  CALCIUM 8.7 8.7  MG -- --  PHOS -- --       CBC:  Basename 06/21/11 0609 06/20/11 0450  WBC 8.8 8.5  NEUTROABS -- --  HGB 11.4* 11.4*  HCT 34.2* 34.2*  MCV 95.0 95.0  PLT 201 183        Medications: I have reviewed the patient's current medications.  Impression: 1. Right-sided pneumonia, healthcare associated. Clinically improving. 2. Influenza B. 3. Acute on chronic congestive heart failure, clinically compensated.     Plan:  1. Continue with current therapy. Repeat chest x-ray today. 2. Possible discharge to the skilled nursing facility tomorrow.     LOS: 5 days   Wilson Singer Pager [phone]  06/21/2011, 10:51 AM

## 2011-06-22 ENCOUNTER — Inpatient Hospital Stay
Admission: RE | Admit: 2011-06-22 | Discharge: 2012-11-26 | Disposition: A | Discharge status: Nursing Home (Includes ICF) | Payer: BC Managed Care – PPO | Source: Ambulatory Visit | Attending: Internal Medicine | Admitting: Internal Medicine

## 2011-06-22 DIAGNOSIS — R52 Pain, unspecified: Secondary | ICD-10-CM

## 2011-06-22 DIAGNOSIS — J189 Pneumonia, unspecified organism: Principal | ICD-10-CM

## 2011-06-22 DIAGNOSIS — W19XXXA Unspecified fall, initial encounter: Secondary | ICD-10-CM

## 2011-06-22 DIAGNOSIS — R05 Cough: Secondary | ICD-10-CM

## 2011-06-22 LAB — BASIC METABOLIC PANEL
CO2: 23 mEq/L (ref 19–32)
Chloride: 101 mEq/L (ref 96–112)
Creatinine, Ser: 0.9 mg/dL (ref 0.50–1.35)
Potassium: 3.2 mEq/L — ABNORMAL LOW (ref 3.5–5.1)

## 2011-06-22 LAB — CLOSTRIDIUM DIFFICILE CULTURE-FECAL

## 2011-06-22 MED ORDER — POTASSIUM CHLORIDE CRYS ER 20 MEQ PO TBCR
40.0000 meq | EXTENDED_RELEASE_TABLET | Freq: Once | ORAL | Status: AC
  Administered 2011-06-22: 40 meq via ORAL
  Filled 2011-06-22: qty 2

## 2011-06-22 MED ORDER — OSELTAMIVIR PHOSPHATE 75 MG PO CAPS: 75.0000 mg | ORAL_CAPSULE | Freq: Two times a day (BID) | ORAL | Status: AC

## 2011-06-22 MED ORDER — FUROSEMIDE 20 MG PO TABS: 40.0000 mg | ORAL_TABLET | Freq: Every day | ORAL | Status: DC

## 2011-06-22 MED ORDER — POTASSIUM CHLORIDE CRYS ER 20 MEQ PO TBCR: 40.0000 meq | EXTENDED_RELEASE_TABLET | Freq: Every day | ORAL | Status: DC

## 2011-06-22 MED ORDER — GUAIFENESIN ER 600 MG PO TB12: 1200.0000 mg | ORAL_TABLET | Freq: Two times a day (BID) | ORAL | Status: AC

## 2011-06-22 MED ORDER — CEFUROXIME AXETIL 500 MG PO TABS: 500.0000 mg | ORAL_TABLET | Freq: Two times a day (BID) | ORAL | Status: AC

## 2011-06-22 NOTE — Progress Notes (Signed)
Patient d/c back to [facility], Report called to Bethann Berkshire, LPN Patient transferred by staff, no c/o pain at d/c Discharge packet sent with patient Family aware of transfer Armando Bukhari, Kae Heller

## 2011-06-22 NOTE — Progress Notes (Signed)
Patient for d/c today to SNF bed at [facility] as prior to this admission. Patient, private duty caregiver and family aware and in agreement with these plans. Plan transfer over after lunch. Reece Levy, MSW, Theresia Majors [phone]

## 2011-06-22 NOTE — Discharge Summary (Signed)
Physician Discharge Summary  Patient ID: Anthony Leon MRN: [id number] DOB/AGE: Jul 12, 1918 78 y.o.  Admit date: 06/16/2011 Discharge date: 06/22/2011    Discharge Diagnoses:  1. Bilateral healthcare associated pneumonia, worse on the right than the left. Clinically improving. 2. Influenza B. 3. Delirium, resolved. 4. Chronic congestive heart failure, compensated. 5. Acute respiratory failure secondary to pneumonia, resolved. 5. Dehydration, resolved. 6. 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   Medication List  As of 06/22/2011 10:16 AM   STOP taking these medications         sulfamethoxazole-trimethoprim 800-160 MG per tablet         TAKE these medications         acetaminophen 500 MG tablet   Commonly known as: TYLENOL   Take 1,000 mg by mouth 2 (two) times daily.      ALPRAZolam 0.25 MG tablet   Commonly known as: XANAX   Take 0.125 mg by mouth at bedtime as needed. For anxiety      amLODipine 5 MG tablet   Commonly known as: NORVASC   Take 5 mg by mouth daily.      bimatoprost 0.01 % Soln   Commonly known as: LUMIGAN   Apply 1 drop to eye at bedtime.      calcium-vitamin D 500-200 MG-UNIT per tablet   Commonly known as: OSCAL WITH D   Take 1 tablet by mouth daily.      cefUROXime 500 MG tablet   Commonly known as: CEFTIN   Take 1 tablet (500 mg total) by mouth 2 (two) times daily.      dipyridamole-aspirin 25-200 MG per 12 hr capsule   Commonly known as: AGGRENOX   Take 1 capsule by mouth 2 (two) times daily.      DUONEB 0.5-2.5 (3) MG/3ML Soln   Generic drug: ipratropium-albuterol   Take 3 mLs by nebulization every 6 (six) hours as needed.      fluticasone 50 MCG/ACT nasal spray   Commonly known as: FLONASE   Place 2 sprays into the nose daily.      furosemide 20 MG tablet   Commonly known as: LASIX   Take 2 tablets (40 mg total) by mouth daily.      guaiFENesin 600 MG 12 hr tablet   Commonly known as: MUCINEX   Take 2 tablets (1,200 mg total) by mouth 2 (two) times daily.      metoprolol tartrate 25 MG tablet   Commonly known as: LOPRESSOR   Take 12.5 mg by mouth 2 (two) times daily.      oseltamivir 75 MG capsule   Commonly known as: TAMIFLU   Take 1 capsule (75 mg total) by mouth 2 (two) times daily.      potassium chloride SA 20 MEQ tablet   Commonly known as: K-DUR,KLOR-CON   Take 2 tablets (40 mEq total) by mouth daily.      sodium chloride 0.65 % nasal spray   Commonly known as: OCEAN   Place 2 sprays into the nose 2 (two) times daily.      Tamsulosin HCl 0.4 MG Caps   Commonly known as: FLOMAX   Take 0.4 mg by mouth daily.            Discharged Condition: Stable and improved.    Consults: None.  Significant Diagnostic Studies: Dg Chest 1 View  06/14/2011  *RADIOLOGY REPORT*  Clinical Data: Cough.  Fever.  CHEST - 1 VIEW  Comparison: Chest x-ray dated  09/19/2009  Findings: The patient has numerous pleural plaques bilaterally as previously demonstrated on CT scan dated 09/18/2009.  Most of the plaques are calcified.  No visible acute infiltrates or effusions.  Heart size and vascularity are normal.  No acute osseous abnormality.  IMPRESSION: No acute abnormalities.  Extensive pleural plaques.  Original Report Authenticated By: Gwynn Burly, M.D.   Dg Chest Port 1 View  06/21/2011  *RADIOLOGY REPORT*  Clinical Data: Pneumonia  PORTABLE CHEST - 1 VIEW  Comparison: 06/17/2011  Findings: There are patchy airspace opacities in the mid and lower lung zones bilaterally, left greater than right, showing some interval improvement   in the lung bases since the previous exam. There is worsening airspace consolidation in the right upper lobe however.  Question small associated pleural effusions.  Heart size upper limits normal as before.  Atheromatous aortic arch.  IMPRESSION:  1.  Bilateral airspace opacities, with worsening right upper lobe disease, and slight improvement in the lung  bases since previous exam.  Original Report Authenticated By: Osa Craver, M.D.   Dg Chest Port 1 View  06/17/2011  *RADIOLOGY REPORT*  Clinical Data: Pneumonia  PORTABLE CHEST - 1 VIEW  Comparison: 06/16/2011  Findings: Patchy right lower lobe opacity, new, suspicious for pneumonia.  Left basilar opacity, possibly atelectasis.  Overlying extensive calcified pleural plaques.  No pneumothorax.  The heart is top normal in size.  IMPRESSION: Patchy right lower lobe opacity, new, suspicious for pneumonia. Left basilar opacity, possibly atelectasis.  Extensive calcified pleural plaques.  Original Report Authenticated By: Charline Bills, M.D.   Dg Chest Port 1 View  06/16/2011  *RADIOLOGY REPORT*  Clinical Data: Cough, shortness of breath, hypoxia, fever, left lower lobe rales.  PORTABLE CHEST - 1 VIEW  Comparison: 06/14/2011  Findings: Cardiac enlargement with normal pulmonary vascularity. Calcified pleural plaques and pleural thickening diffusely and bilaterally, stable since prior study.  No definite evidence of any focal airspace consolidation.  No blunting of costophrenic angles. No pneumothorax.  Calcification of the aorta.  Prominent soft tissue in the right paratracheal region may represent vascular shadow or thyroid goiter.  Degenerative changes in the shoulders.  IMPRESSION: Cardiac enlargement.  Diffuse bilateral calcified pleural plaques and pleural thickening.  Stable appearance since previous study.  Original Report Authenticated By: Marlon Pel, M.D.    Lab Results: Basic Metabolic Panel:  Basename 06/22/11 0547 06/21/11 0609  NA 134* 136  K 3.2* 3.7  CL 101 103  CO2 23 21  GLUCOSE 103* 92  BUN 18 24*  CREATININE 0.90 0.95  CALCIUM 8.3* 8.7  MG -- --  PHOS -- --   Liver Function Tests:  [facility] 06/21/11 0609  AST 17  ALT 11  ALKPHOS 42  BILITOT 1.1  PROT 6.4  ALBUMIN 2.7*     CBC:  Basename 06/21/11 0609 06/20/11 0450  WBC 8.8 8.5  NEUTROABS -- --    HGB 11.4* 11.4*  HCT 34.2* 34.2*  MCV 95.0 95.0  PLT 201 183    Recent Results (from the past 240 hour(s))  CULTURE, BLOOD (ROUTINE X 2)     Status: Normal   Collection Time   06/16/11  8:26 PM      Component Value Range Status Comment   Specimen Description BLOOD LEFT HAND   Final    Special Requests BOTTLES DRAWN AEROBIC AND ANAEROBIC 5CC   Final    Culture NO GROWTH 5 DAYS   Final    Report Status 06/21/2011  FINAL   Final   CULTURE, BLOOD (ROUTINE X 2)     Status: Normal   Collection Time   06/16/11  8:43 PM      Component Value Range Status Comment   Specimen Description BLOOD LEFT ANTECUBITAL   Final    Special Requests BOTTLES DRAWN AEROBIC AND ANAEROBIC 5CC   Final    Culture NO GROWTH 5 DAYS   Final    Report Status 06/21/2011 FINAL   Final   URINE CULTURE     Status: Normal   Collection Time   06/16/11  8:46 PM      Component Value Range Status Comment   Specimen Description URINE, CATHETERIZED   Final    Special Requests NONE   Final    Culture  Setup Time [id number]   Final    Colony Count NO GROWTH   Final    Culture NO GROWTH   Final    Report Status 06/18/2011 FINAL   Final   STOOL CULTURE     Status: Normal   Collection Time   06/17/11 10:33 AM      Component Value Range Status Comment   Specimen Description STOOL   Final    Special Requests NONE   Final    Culture     Final    Value: NO SALMONELLA, SHIGELLA, CAMPYLOBACTER, OR YERSINIA ISOLATED   Report Status 06/21/2011 FINAL   Final   CULTURE, SPUTUM-ASSESSMENT     Status: Normal (Preliminary result)   Collection Time   06/17/11 10:34 AM      Component Value Range Status Comment   Specimen Description SPUTUM   Final    Special Requests NONE   Final    Sputum evaluation     Final    Value: THIS SPECIMEN IS ACCEPTABLE. RESPIRATORY CULTURE REPORT TO FOLLOW.   Report Status PENDING   Incomplete   CULTURE, RESPIRATORY     Status: Normal   Collection Time   06/17/11 10:35 AM      Component Value Range Status  Comment   Specimen Description SPUTUM   Final    Special Requests NONE   Final    Gram Stain     Final    Value: NO WBC SEEN     RARE SQUAMOUS EPITHELIAL CELLS PRESENT     NO ORGANISMS SEEN   Culture NORMAL OROPHARYNGEAL FLORA   Final    Report Status 06/20/2011 FINAL   Final   RESPIRATORY VIRUS PANEL (18 COMPONENTS)     Status: Abnormal (Preliminary result)   Collection Time   06/17/11  3:11 PM      Component Value Range Status Comment   Source - RVPAN NOSE   Final    Respiratory Syncytial Virus A NOT DETECTED   Final    Respiratory Syncytial Virus B NOT DETECTED   Final    Influenza A NOT DETECTED   Final    Influenza B DETECTED (*)  Final    Parainfluenza 1 NOT DETECTED   Final    Parainfluenza 2 NOT DETECTED   Final    Parainfluenza 3 NOT DETECTED   Final    Parainfluenza 4 NOT DETECTED   Final    Metapneumovirus NOT DETECTED   Final    Coxsackie and Echovirus NOT DETECTED   Final    Rhinovirus NOT DETECTED   Final    Adenovirus B NOT DETECTED   Final    Adenovirus E NOT DETECTED   Final  CoronavirusNL63 NOT DETECTED   Final    CoronavirusHKU1 NOT DETECTED   Final    Coronavirus229E NOT DETECTED   Final    CoronavirusOC43 NOT DETECTED   Final    Bocavirus PENDING   Incomplete   CLOSTRIDIUM DIFFICILE BY PCR     Status: Normal   Collection Time   06/19/11  1:58 AM      Component Value Range Status Comment   C difficile by pcr NEGATIVE  NEGATIVE  Final      Hospital Course: This 78 year old man presented to the hospital with symptoms of fever, cough and confusion for 3 days prior to hospitalization. He was seeing in the admission, he was noted to be dehydrated and clinically was felt to have pneumonia. After rehydration pneumonia was clearly seen more in the right. He has been treated appropriately for 5 days of intravenous antibiotics with vancomycin and cefepime. He has done extremely well, does not require oxygen and is tolerating oral intake well. His dehydration is improved  with intravenous fluids. He appears to have cleared his confusion also and according to the staff from the nursing home, who are at his bedside, he appears to be back to his normal self.  Discharge Exam: Blood pressure 114/60, pulse 82, temperature 98.2 F (36.8 C), temperature source Oral, resp. rate 20, height 5\' 11"  (1.803 m), weight 83.2 kg (183 lb 6.8 oz), SpO2 92.00%. He looks systemically well. He is alert and engaging in conversation. There is no increased work of breathing. There is no peripheral or central cyanosis. Lung fields show bilateral crackles in the mid and lower zones more on the right. He is not clinically in heart failure. Heart sounds are present and normal without murmurs.  Disposition: Skilled nursing facility. She will require further 5 days of oral antibiotics. Although his chest x-ray does look worse when it was done yesterday, he is clinically much improved and I think that there will be a time lag before his chest x-ray also improves. I would suggest that his chest x-ray be repeated in approximately 4 weeks.  Discharge Orders    Future Orders Please Complete By Expires   Diet - low sodium heart healthy      Increase activity slowly           SignedWilson Singer Pager [phone]  06/22/2011, 10:16 AM

## 2011-07-25 ENCOUNTER — Ambulatory Visit (HOSPITAL_COMMUNITY)
Admit: 2011-07-25 | Discharge: 2011-07-25 | Disposition: A | Discharge status: Home / Self Care | Payer: Medicare Other | Attending: Internal Medicine | Admitting: Internal Medicine

## 2011-07-25 DIAGNOSIS — J189 Pneumonia, unspecified organism: Secondary | ICD-10-CM | POA: Insufficient documentation

## 2011-07-25 DIAGNOSIS — I700 Atherosclerosis of aorta: Secondary | ICD-10-CM | POA: Insufficient documentation

## 2011-08-11 LAB — RESPIRATORY VIRUS PANEL
Adenovirus B: NOT DETECTED
Adenovirus E: NOT DETECTED
Coronavirus229E: NOT DETECTED
CoronavirusHKU1: NOT DETECTED
CoronavirusNL63: NOT DETECTED
Influenza A: NOT DETECTED
Influenza B: DETECTED — AB
Parainfluenza 1: NOT DETECTED
Parainfluenza 4: NOT DETECTED
Respiratory Syncytial Virus A: NOT DETECTED
Rhinovirus: NOT DETECTED

## 2011-08-27 ENCOUNTER — Ambulatory Visit (HOSPITAL_COMMUNITY): Payer: Medicare Other | Attending: Internal Medicine

## 2011-08-27 DIAGNOSIS — R059 Cough, unspecified: Secondary | ICD-10-CM | POA: Insufficient documentation

## 2011-08-27 DIAGNOSIS — R918 Other nonspecific abnormal finding of lung field: Secondary | ICD-10-CM | POA: Insufficient documentation

## 2011-08-27 DIAGNOSIS — R05 Cough: Secondary | ICD-10-CM | POA: Insufficient documentation

## 2011-08-31 LAB — EXPECTORATED SPUTUM ASSESSMENT W GRAM STAIN, RFLX TO RESP C

## 2012-07-05 ENCOUNTER — Non-Acute Institutional Stay (SKILLED_NURSING_FACILITY): Payer: Medicare Other | Admitting: Internal Medicine

## 2012-07-05 DIAGNOSIS — I6350 Cerebral infarction due to unspecified occlusion or stenosis of unspecified cerebral artery: Secondary | ICD-10-CM

## 2012-07-05 DIAGNOSIS — N400 Enlarged prostate without lower urinary tract symptoms: Secondary | ICD-10-CM

## 2012-07-05 DIAGNOSIS — R634 Abnormal weight loss: Secondary | ICD-10-CM

## 2012-07-05 DIAGNOSIS — I10 Essential (primary) hypertension: Secondary | ICD-10-CM

## 2012-07-05 DIAGNOSIS — R197 Diarrhea, unspecified: Secondary | ICD-10-CM

## 2012-07-05 DIAGNOSIS — I509 Heart failure, unspecified: Secondary | ICD-10-CM

## 2012-07-05 DIAGNOSIS — F411 Generalized anxiety disorder: Secondary | ICD-10-CM

## 2012-07-05 DIAGNOSIS — I639 Cerebral infarction, unspecified: Secondary | ICD-10-CM

## 2012-07-06 DIAGNOSIS — F411 Generalized anxiety disorder: Secondary | ICD-10-CM | POA: Insufficient documentation

## 2012-07-06 DIAGNOSIS — R197 Diarrhea, unspecified: Secondary | ICD-10-CM | POA: Insufficient documentation

## 2012-07-06 DIAGNOSIS — R634 Abnormal weight loss: Secondary | ICD-10-CM | POA: Insufficient documentation

## 2012-07-06 DIAGNOSIS — I639 Cerebral infarction, unspecified: Secondary | ICD-10-CM | POA: Insufficient documentation

## 2012-07-06 DIAGNOSIS — I10 Essential (primary) hypertension: Secondary | ICD-10-CM | POA: Insufficient documentation
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
## 2012-07-07 ENCOUNTER — Non-Acute Institutional Stay (SKILLED_NURSING_FACILITY): Payer: Medicare Other | Admitting: Internal Medicine

## 2012-07-07 DIAGNOSIS — I509 Heart failure, unspecified: Secondary | ICD-10-CM

## 2012-07-07 DIAGNOSIS — R197 Diarrhea, unspecified: Secondary | ICD-10-CM

## 2012-07-07 NOTE — Progress Notes (Signed)
Patient ID: Anthony Leon, male   DOB: 01-02-1919, 79 y.o.   MRN: [id number]  Chief complaint-acute visit secondary to diarrhea.  History of present illness.  Patient is a pleasant elderly resident who a couple days ago developed significant diarrhea-apparently this was somewhat watery.  He apparently had fairly profuse diarrhea couple days ago but this has slowed down apparently during the day he's had a couple loose stools but there was some form to them according to nursing staff.  Is not really complaining of abdominal pain he is taking fluids well and this is by staff as well as his sitter.  He still does not have a very good appetite however.  His vital signs have been stable he is not running a temperature--we did order C. difficile cultures so far they have been negative x1  We have been holding his Lasix secondary to dehydration concerns-lab work was obtained yesterday which was unremarkable.  Medical social history has been reviewed  Medications have been reviewed.  Review of systems somewhat limited since the patient is a poor historian.  In general no fever or chills noted recently.  Cardiac history CHF but denies any shortness of breath or chest pain.  Respiratory-denies shortness of breath or cough.  GI- had some diarrhea but this is slowing down-is not really complaining of abdominal pain nausea or vomiting apparently did have nausea a couple days ago.  Neurologic-no complaints of headache or dizziness.  Physical exam.  Temperature is 98.5 pulse 77 respirations 20 blood pressure 137/70.  In general this is somewhat frail male who appears to be in no distress and at his baseline he is lying comfortably in bed and did rise to the edge of the bed without any difficulty.  Skin is warm and dry.  Oropharynx is clear mucous membranes moist.  Chest is clear to auscultation without rhonchi rales or wheezes.  Abdomen is soft minimally tender to palpation there are  active bowel sounds no distention.  Extremities-Gonzales baseline I do not really see any evidence of lower extremity edema.  Labs.  07/04/2012.  WBC 10.3 hemoglobin 10.4 platelets 562.  Sodium 139 potassium 4 BUN 16 creatinine 0.92.  Assessment and plan.  #1-diarrhea-this appears to be resolving at this point would suspect this was a virus-continue to push fluids aggressively.--Update metabolic panel early next week  2 history CHF-this appears to be stable despite Lasix being held we'll continue to monitor -- suspect we can restart Lasix early next week-as long as the diarrhea resolved.  [id number]

## 2012-07-24 ENCOUNTER — Non-Acute Institutional Stay (SKILLED_NURSING_FACILITY): Payer: Medicare PPO | Admitting: Internal Medicine

## 2012-07-24 DIAGNOSIS — F0390 Unspecified dementia without behavioral disturbance: Secondary | ICD-10-CM

## 2012-07-24 DIAGNOSIS — F0630 Mood disorder due to known physiological condition, unspecified: Secondary | ICD-10-CM | POA: Insufficient documentation

## 2012-07-24 DIAGNOSIS — I509 Heart failure, unspecified: Secondary | ICD-10-CM

## 2012-07-24 NOTE — Progress Notes (Signed)
Patient ID: Anthony Leon, male   DOB: 07-Jan-1919, 79 y.o.   MRN: [id number]      Chief complaint-acute visit secondary to confusion-moodiness-agitation.  History of present illness.  Patient is a pleasant elderly resident who has been a long-term resident of this facility.  His sitters and family is concerned stating that he's been somewhat more confused and agitated recently.  Fact his sister tells me he called her about 10 times this morning-he appears to have some mild/moderate dementia which may be progressing-.  He does have a history of anxiety in the is on Xanax at night which appears to help.  According to the sitter and sister who are in the room he's had increased periods of agitation moodiness-he was going to go on a ride with his sister today but decided not to at the last moment --apparently he is verbally lashing out at times  att his sitters with increased frequency  He is denying any pain-dysuria-or discomfort.  He does state he worries at times about his farmland-this apparently is being cared for by family--and according to his sister there has not really been any problems in this regard.  Vital signs continued to be stable --really has no acute complaints but he is having increased periods of agitation and moodiness  Family medical social history has been a history and physical on 06/27/2011.  Medications have been reviewed per MAR.  Review of systems.  Denies any fever or chills.  Respiratory denies any cough or shortness of breath.  Cardiac-no complaints of chest pain does have history of CHF.  GU-denies any dysuria.  Muscle skeletal-denies any joint pain or discomfort.  Neurologic-no complaints of headache or dizziness.  Physical exam.  He is afebrile pulse of 89 respirations 20 blood pressure 102/78-weight is 173--.  In general this is a somewhat frail elderly male in no distress sitting to be in his wheelchair he is pleasant and appropriate  --according to the family previously mentioned behaviors are usually not exhibited by him in my presence.  Skin is warm and dry.  Eyes pupils appear equal round reactive light sclerae and conjunctivae are clear.  Oropharynx clear mucous membranes moist.  Chest is clear to auscultation without rhonchi rales or wheezes.  Heart is regular irregular rate and rhythm no murmur gallop or rub.  He has trace lower extremity edema.  Abdomen soft nontender positive bowel sounds.  GU-could not appreciate any suprapubic tenderness.  Muscle skeletal appears to be at baseline has arthritic changes of his lower extremities but moves all extremities at baseline does occasionally use a walker but mainly ambulation wheelchair and has a full-time sitter-baseline musculoskeletal exam today.  Neurologic is grossly intact grip strength is good bilaterally do not really note any focal lateralizing findings his speech is clear.  Psych he is oriented to self vaguely to place-he could tell me that he has farmland that he is concerned about Anthony Leon was pleasant and appropriate again apparently agitated behaviors are usually not present when I am in the room.  Labs.  07/09/2012.  Sodium 139 potassium 3.4 BUN 17 creatinine 1.38.  07/06/2012.  WBC 7.4 hemoglobin 12.3 platelets 165.  Liver function tests within normal limits except albumin of 3.4.  .  Assessment and plan.  Number 1-- question dementia with agitation-Will rule out metabolic cause initially will obtain a TSH as well as a urine culture-also CBC with differential-I suspect if these come back benign-we'll consider a mood stabilizer Depakote this was discussed extensively with  his responsible party his sister at bedside--will also have social work or do a Mini-Mental Status exam for review I would suspect he has moderate dementia.  #2-CHF-Lasix has been held for the past 2-3 weeks he did have an episode of diarrhea late last month we held his Lasix  and potassium-clinically he appears stable in this regard we'll have staff weigh him once a week and monitor for any weight gain--clinically he is not showing  signs of any worsening CHF-he was on low-dose Lasix--weight on the first of this month actually showed a slight decrease from the previous month--relatively baseline with previous exams  [id number]-of note 30 minutes spent assessing patient -- discussing his status with his sister at bedside-as well as formulating plan of care

## 2012-07-25 ENCOUNTER — Other Ambulatory Visit: Payer: Self-pay | Admitting: Geriatric Medicine

## 2012-07-25 MED ORDER — LORAZEPAM 0.5 MG PO TABS: ORAL_TABLET | ORAL | Status: DC

## 2012-07-26 ENCOUNTER — Non-Acute Institutional Stay (SKILLED_NURSING_FACILITY): Payer: Medicare PPO | Admitting: Internal Medicine

## 2012-07-26 DIAGNOSIS — F0390 Unspecified dementia without behavioral disturbance: Secondary | ICD-10-CM

## 2012-07-26 DIAGNOSIS — F411 Generalized anxiety disorder: Secondary | ICD-10-CM

## 2012-07-26 DIAGNOSIS — F0630 Mood disorder due to known physiological condition, unspecified: Secondary | ICD-10-CM

## 2012-07-29 ENCOUNTER — Encounter: Payer: Self-pay | Admitting: Internal Medicine

## 2012-07-29 DIAGNOSIS — F0390 Unspecified dementia without behavioral disturbance: Secondary | ICD-10-CM

## 2012-07-29 DIAGNOSIS — F0630 Mood disorder due to known physiological condition, unspecified: Secondary | ICD-10-CM

## 2012-07-29 NOTE — Progress Notes (Signed)
Patient ID: Anthony Leon, male   DOB: [birth year], 79 y.o.   MRN: [id number] this is an acute visit.  The facility is [facility]   Level of care skilled.  Chief complaint-acute visit followup dementia with mood disorder anxiety.  History of present illness.  Patient is an elderly resident who has had somewhat of a difficult week.  Earlier this week he had significant agitation with his sitter as well as his sister-refusing to go on a short trip with his sister which he usually does-had significant anxiety concerned about his farmland and how it was being handled  Per his sister there is no foundation to these concerns and basically feels it may be related to his confusion-dementia.  We did prescribe Ativan 0.5 mg twice a day when necessary-he is on Xanax very low dose routinely at night.  His status is much better today.  He is back at his baseline cooperative although somewhat confused-he does not appear he stated the when necessary Ativan much.  We did order a urine as well as CBC and basic metabolic panel-all these appear relatively unremarkable-the urinalysis appears quite benign.  Of note his TSH also was within normal limits.  His vital signs continued to be stable.  Family medical social history as been reviewed per history and physical on 06/27/2011.  Medications have been reviewed per MAR.  Review of systems-somewhat limited secondary to dementia.  Gen. no complaints of fever chills.  Respiratory no complaints of cough or shortness of breath.  Cardiac-does not complaining of chest pain does have a history CHF.  GI-does not complaining of any abdominal pain nausea vomiting diarrhea constipation.  GU-denies dysuria.  The muscle skeletal-does not complaining of joint pain.  Neurologic-does not complaining of headache or dizziness.  Physical exam.  Temperature is 98.1 pulse 80 respirations 20 blood pressure 136/86.  In general this is a somewhat frail  elderly male in no distress.  The skin is warm and dry.  Chest is clear to auscultation without rhonchi rales or wheezes.  Heart irregular irregular rate and rhythm without murmur gallop or rub.  Psych -- is oriented to self-pleasant and cooperative tonight does not appear to be agitated And apparently that's been his status the last couple days.  Labs.  07/25/2012.  WBC 8.8 hemoglobin 11.5 platelets 160.  TSH-1.74.  07/23/2012.  Sodium 139 potassium 3.6 BUN 14 creatinine 1.23.  Urinalysis-appears grossly negative.  Assessment and plan.  #1-dementia with mood disorder-anxiety-this appears to have stabilized ---at one point did consider Depakote if this reoccurs certainly will consider this --lab work does not really reveal any metabolic cause  [id number]

## 2012-08-21 ENCOUNTER — Non-Acute Institutional Stay (SKILLED_NURSING_FACILITY): Payer: Medicare PPO | Admitting: Internal Medicine

## 2012-08-21 DIAGNOSIS — I639 Cerebral infarction, unspecified: Secondary | ICD-10-CM

## 2012-08-21 DIAGNOSIS — F03918 Unspecified dementia, unspecified severity, with other behavioral disturbance: Secondary | ICD-10-CM | POA: Insufficient documentation

## 2012-08-21 DIAGNOSIS — I6350 Cerebral infarction due to unspecified occlusion or stenosis of unspecified cerebral artery: Secondary | ICD-10-CM

## 2012-08-21 DIAGNOSIS — I509 Heart failure, unspecified: Secondary | ICD-10-CM

## 2012-08-21 DIAGNOSIS — F411 Generalized anxiety disorder: Secondary | ICD-10-CM

## 2012-08-21 DIAGNOSIS — N400 Enlarged prostate without lower urinary tract symptoms: Secondary | ICD-10-CM

## 2012-08-21 DIAGNOSIS — F0630 Mood disorder due to known physiological condition, unspecified: Secondary | ICD-10-CM

## 2012-08-21 DIAGNOSIS — F0391 Unspecified dementia with behavioral disturbance: Secondary | ICD-10-CM

## 2012-08-21 DIAGNOSIS — I10 Essential (primary) hypertension: Secondary | ICD-10-CM
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
## 2012-08-24 ENCOUNTER — Ambulatory Visit (HOSPITAL_COMMUNITY): Payer: Medicare PPO | Attending: Internal Medicine

## 2012-08-24 DIAGNOSIS — R079 Chest pain, unspecified: Secondary | ICD-10-CM | POA: Insufficient documentation

## 2012-09-04 ENCOUNTER — Other Ambulatory Visit: Payer: Self-pay | Admitting: *Deleted

## 2012-09-04 MED ORDER — CLONAZEPAM 0.5 MG PO TABS: ORAL_TABLET | ORAL | Status: DC

## 2012-11-06 ENCOUNTER — Non-Acute Institutional Stay (SKILLED_NURSING_FACILITY): Payer: Medicare PPO | Admitting: Internal Medicine

## 2012-11-06 DIAGNOSIS — F05 Delirium due to known physiological condition: Secondary | ICD-10-CM

## 2012-11-06 DIAGNOSIS — I10 Essential (primary) hypertension: Secondary | ICD-10-CM

## 2012-11-06 DIAGNOSIS — R52 Pain, unspecified: Secondary | ICD-10-CM

## 2012-11-06 DIAGNOSIS — I5042 Chronic combined systolic (congestive) and diastolic (congestive) heart failure: Secondary | ICD-10-CM

## 2012-11-06 DIAGNOSIS — I509 Heart failure, unspecified: Secondary | ICD-10-CM

## 2012-11-06 DIAGNOSIS — I6350 Cerebral infarction due to unspecified occlusion or stenosis of unspecified cerebral artery: Secondary | ICD-10-CM

## 2012-11-06 DIAGNOSIS — I639 Cerebral infarction, unspecified: Secondary | ICD-10-CM

## 2012-11-06 DIAGNOSIS — F0280 Dementia in other diseases classified elsewhere without behavioral disturbance: Secondary | ICD-10-CM

## 2012-11-17 ENCOUNTER — Non-Acute Institutional Stay (SKILLED_NURSING_FACILITY): Payer: Medicare PPO | Admitting: Internal Medicine

## 2012-11-17 DIAGNOSIS — B49 Unspecified mycosis: Secondary | ICD-10-CM

## 2012-11-17 NOTE — Progress Notes (Signed)
Patient ID: Anthony Leon, male   DOB: 03-27-1918, 79 y.o.   MRN: [id number] This is an acute visit.  Level of care skilled.  Facility [facility].  Chief complaint-acute visit secondary to toenail issues  History of present illness.  Patient is a pleasant elderly resident last his sister is concerned about some fungal changes to his great toenails bilaterally and would like it assessed.  Patient does not complaining of any pain or discomfort.  Physical exam.  The nails on his great toes bilaterally do show fungal changes with a grayish appearance-I do not see any signs of cellulitis surrounding erythema edema or infection.  This appears to be fungal changes.  Assessment and plan.  Nail fungus -discussed this with his sister-RP- via phone-treatment options are limited -- discuss possibility of oral agent but there are a Hepatic side effects here-his responsible party did express understanding of this-at this point we'll continue to monitor or possibly consider white vinegar soaks although I suspect the effectiveness of this would be limited at best  [id number]

## 2012-11-26 ENCOUNTER — Encounter (HOSPITAL_COMMUNITY): Payer: Self-pay | Admitting: *Deleted

## 2012-11-26 ENCOUNTER — Emergency Department (HOSPITAL_COMMUNITY): Payer: Medicare PPO

## 2012-11-26 ENCOUNTER — Encounter (HOSPITAL_COMMUNITY)
Admission: EM | Disposition: A | Discharge status: Skilled Nursing Facility | Payer: Self-pay | Source: Home / Self Care | Attending: Family Medicine

## 2012-11-26 ENCOUNTER — Inpatient Hospital Stay (HOSPITAL_COMMUNITY)
Admission: EM | Admit: 2012-11-26 | Discharge: 2012-12-05 | DRG: 480 | Disposition: A | Discharge status: Skilled Nursing Facility | Payer: Medicare PPO | Attending: Internal Medicine | Admitting: Internal Medicine

## 2012-11-26 DIAGNOSIS — Z66 Do not resuscitate: Secondary | ICD-10-CM | POA: Diagnosis present

## 2012-11-26 DIAGNOSIS — N138 Other obstructive and reflux uropathy: Secondary | ICD-10-CM | POA: Diagnosis present

## 2012-11-26 DIAGNOSIS — S72143A Displaced intertrochanteric fracture of unspecified femur, initial encounter for closed fracture: Principal | ICD-10-CM

## 2012-11-26 DIAGNOSIS — H409 Unspecified glaucoma: Secondary | ICD-10-CM | POA: Diagnosis present

## 2012-11-26 DIAGNOSIS — Z86718 Personal history of other venous thrombosis and embolism: Secondary | ICD-10-CM

## 2012-11-26 DIAGNOSIS — I9589 Other hypotension: Secondary | ICD-10-CM | POA: Diagnosis not present

## 2012-11-26 DIAGNOSIS — R0902 Hypoxemia: Secondary | ICD-10-CM | POA: Diagnosis present

## 2012-11-26 DIAGNOSIS — Z8673 Personal history of transient ischemic attack (TIA), and cerebral infarction without residual deficits: Secondary | ICD-10-CM

## 2012-11-26 DIAGNOSIS — I509 Heart failure, unspecified: Secondary | ICD-10-CM

## 2012-11-26 DIAGNOSIS — R197 Diarrhea, unspecified: Secondary | ICD-10-CM

## 2012-11-26 DIAGNOSIS — S72141A Displaced intertrochanteric fracture of right femur, initial encounter for closed fracture: Secondary | ICD-10-CM

## 2012-11-26 DIAGNOSIS — D649 Anemia, unspecified: Secondary | ICD-10-CM

## 2012-11-26 DIAGNOSIS — F05 Delirium due to known physiological condition: Secondary | ICD-10-CM

## 2012-11-26 DIAGNOSIS — Z8701 Personal history of pneumonia (recurrent): Secondary | ICD-10-CM

## 2012-11-26 DIAGNOSIS — Z79899 Other long term (current) drug therapy: Secondary | ICD-10-CM

## 2012-11-26 DIAGNOSIS — N401 Enlarged prostate with lower urinary tract symptoms: Secondary | ICD-10-CM | POA: Diagnosis present

## 2012-11-26 DIAGNOSIS — J189 Pneumonia, unspecified organism: Secondary | ICD-10-CM

## 2012-11-26 DIAGNOSIS — F0391 Unspecified dementia with behavioral disturbance: Secondary | ICD-10-CM | POA: Diagnosis present

## 2012-11-26 DIAGNOSIS — F0390 Unspecified dementia without behavioral disturbance: Secondary | ICD-10-CM

## 2012-11-26 DIAGNOSIS — W010XXA Fall on same level from slipping, tripping and stumbling without subsequent striking against object, initial encounter: Secondary | ICD-10-CM | POA: Diagnosis present

## 2012-11-26 DIAGNOSIS — R509 Fever, unspecified: Secondary | ICD-10-CM | POA: Diagnosis present

## 2012-11-26 DIAGNOSIS — E860 Dehydration: Secondary | ICD-10-CM

## 2012-11-26 DIAGNOSIS — I5033 Acute on chronic diastolic (congestive) heart failure: Secondary | ICD-10-CM | POA: Diagnosis present

## 2012-11-26 DIAGNOSIS — J9600 Acute respiratory failure, unspecified whether with hypoxia or hypercapnia: Secondary | ICD-10-CM

## 2012-11-26 DIAGNOSIS — I10 Essential (primary) hypertension: Secondary | ICD-10-CM | POA: Diagnosis present

## 2012-11-26 DIAGNOSIS — J101 Influenza due to other identified influenza virus with other respiratory manifestations: Secondary | ICD-10-CM

## 2012-11-26 DIAGNOSIS — G9340 Encephalopathy, unspecified: Secondary | ICD-10-CM

## 2012-11-26 DIAGNOSIS — R238 Other skin changes: Secondary | ICD-10-CM | POA: Diagnosis present

## 2012-11-26 DIAGNOSIS — E871 Hypo-osmolality and hyponatremia: Secondary | ICD-10-CM | POA: Diagnosis not present

## 2012-11-26 DIAGNOSIS — Z993 Dependence on wheelchair: Secondary | ICD-10-CM

## 2012-11-26 DIAGNOSIS — Y9210 Unspecified residential institution as the place of occurrence of the external cause: Secondary | ICD-10-CM | POA: Diagnosis present

## 2012-11-26 DIAGNOSIS — R634 Abnormal weight loss: Secondary | ICD-10-CM

## 2012-11-26 DIAGNOSIS — F03918 Unspecified dementia, unspecified severity, with other behavioral disturbance: Secondary | ICD-10-CM | POA: Diagnosis present

## 2012-11-26 DIAGNOSIS — I639 Cerebral infarction, unspecified: Secondary | ICD-10-CM | POA: Diagnosis present

## 2012-11-26 DIAGNOSIS — R339 Retention of urine, unspecified: Secondary | ICD-10-CM | POA: Diagnosis present

## 2012-11-26 DIAGNOSIS — N400 Enlarged prostate without lower urinary tract symptoms: Secondary | ICD-10-CM | POA: Diagnosis present

## 2012-11-26 DIAGNOSIS — A419 Sepsis, unspecified organism: Secondary | ICD-10-CM | POA: Diagnosis not present

## 2012-11-26 DIAGNOSIS — Z880 Allergy status to penicillin: Secondary | ICD-10-CM

## 2012-11-26 DIAGNOSIS — F0630 Mood disorder due to known physiological condition, unspecified: Secondary | ICD-10-CM

## 2012-11-26 DIAGNOSIS — F411 Generalized anxiety disorder: Secondary | ICD-10-CM | POA: Diagnosis present

## 2012-11-26 DIAGNOSIS — N390 Urinary tract infection, site not specified: Secondary | ICD-10-CM | POA: Diagnosis present

## 2012-11-26 LAB — URINALYSIS, ROUTINE W REFLEX MICROSCOPIC
Bilirubin Urine: NEGATIVE
Glucose, UA: NEGATIVE mg/dL
Specific Gravity, Urine: 1.02 (ref 1.005–1.030)

## 2012-11-26 LAB — BASIC METABOLIC PANEL
BUN: 14 mg/dL (ref 6–23)
Chloride: 104 mEq/L (ref 96–112)
GFR calc Af Amer: 57 mL/min — ABNORMAL LOW (ref >90)
GFR calc non Af Amer: 50 mL/min — ABNORMAL LOW (ref >90)
Potassium: 4 mEq/L (ref 3.5–5.1)
Sodium: 138 mEq/L (ref 135–145)

## 2012-11-26 LAB — CBC WITH DIFFERENTIAL/PLATELET
Basophils Absolute: 0 10*3/uL (ref 0.0–0.1)
Basophils Relative: 0 % (ref 0–1)
Eosinophils Absolute: 0.1 10*3/uL (ref 0.0–0.7)
Hemoglobin: 12.2 g/dL — ABNORMAL LOW (ref 13.0–17.0)
MCH: 31.4 pg (ref 26.0–34.0)
MCHC: 32.9 g/dL (ref 30.0–36.0)
Monocytes Relative: 10 % (ref 3–12)
Neutro Abs: 2.8 10*3/uL (ref 1.7–7.7)
Neutrophils Relative %: 49 % (ref 43–77)
Platelets: 148 10*3/uL — ABNORMAL LOW (ref 150–400)
RDW: 13.8 % (ref 11.5–15.5)

## 2012-11-26 LAB — URINE MICROSCOPIC-ADD ON

## 2012-11-26 LAB — PROTIME-INR: Prothrombin Time: 14.8 seconds (ref 11.6–15.2)

## 2012-11-26 LAB — SURGICAL PCR SCREEN: Staphylococcus aureus: NEGATIVE

## 2012-11-26 SURGERY — FIXATION, FRACTURE, INTERTROCHANTERIC, WITH INTRAMEDULLARY ROD
Anesthesia: Spinal | Laterality: Right

## 2012-11-26 MED ORDER — TAMSULOSIN HCL 0.4 MG PO CAPS
0.4000 mg | ORAL_CAPSULE | Freq: Every day | ORAL | Status: DC
  Administered 2012-11-26 – 2012-12-04 (×6): 0.4 mg via ORAL
  Filled 2012-11-26 (×7): qty 1

## 2012-11-26 MED ORDER — DIVALPROEX SODIUM 125 MG PO DR TAB
125.0000 mg | DELAYED_RELEASE_TABLET | Freq: Three times a day (TID) | ORAL | Status: DC
  Administered 2012-11-26 – 2012-12-05 (×20): 125 mg via ORAL
  Filled 2012-11-26 (×34): qty 1

## 2012-11-26 MED ORDER — INFLUENZA VAC SPLIT QUAD 0.5 ML IM SUSP
0.5000 mL | INTRAMUSCULAR | Status: AC
  Filled 2012-11-26: qty 0.5

## 2012-11-26 MED ORDER — ONDANSETRON HCL 4 MG PO TABS: 4.0000 mg | ORAL_TABLET | Freq: Four times a day (QID) | ORAL | Status: DC | PRN

## 2012-11-26 MED ORDER — METOPROLOL TARTRATE 25 MG PO TABS
12.5000 mg | ORAL_TABLET | Freq: Two times a day (BID) | ORAL | Status: DC
  Administered 2012-11-26 – 2012-11-28 (×5): 12.5 mg via ORAL
  Filled 2012-11-26 (×6): qty 1

## 2012-11-26 MED ORDER — HYDROCODONE-ACETAMINOPHEN 5-325 MG PO TABS
1.0000 | ORAL_TABLET | ORAL | Status: DC | PRN
  Administered 2012-11-26: 2 via ORAL
  Filled 2012-11-26: qty 2

## 2012-11-26 MED ORDER — LORAZEPAM 0.5 MG PO TABS
0.5000 mg | ORAL_TABLET | Freq: Two times a day (BID) | ORAL | Status: DC | PRN
  Administered 2012-11-27: 0.5 mg via ORAL
  Filled 2012-11-26: qty 1

## 2012-11-26 MED ORDER — ACETAMINOPHEN 650 MG RE SUPP
650.0000 mg | Freq: Four times a day (QID) | RECTAL | Status: DC | PRN
  Administered 2012-11-29 – 2012-11-30 (×2): 650 mg via RECTAL
  Filled 2012-11-26 (×2): qty 1

## 2012-11-26 MED ORDER — CHLORHEXIDINE GLUCONATE 4 % EX LIQD: 60.0000 mL | Freq: Once | CUTANEOUS | Status: DC

## 2012-11-26 MED ORDER — VANCOMYCIN HCL IN DEXTROSE 1-5 GM/200ML-% IV SOLN: 1000.0000 mg | INTRAVENOUS | Status: DC

## 2012-11-26 MED ORDER — HYDROCODONE-ACETAMINOPHEN 5-325 MG PO TABS: 1.0000 | ORAL_TABLET | ORAL | Status: DC | PRN

## 2012-11-26 MED ORDER — CLONAZEPAM 0.5 MG PO TABS
0.2500 mg | ORAL_TABLET | Freq: Every day | ORAL | Status: DC
  Administered 2012-11-26: 0.25 mg via ORAL
  Filled 2012-11-26: qty 1

## 2012-11-26 MED ORDER — CHLORHEXIDINE GLUCONATE 4 % EX LIQD
60.0000 mL | Freq: Once | CUTANEOUS | Status: AC
  Administered 2012-11-28: 4 via TOPICAL
  Filled 2012-11-26: qty 15

## 2012-11-26 MED ORDER — SODIUM CHLORIDE 0.9 % IV SOLN
INTRAVENOUS | Status: DC
  Administered 2012-11-26: 50 mL/h via INTRAVENOUS
  Administered 2012-11-27 – 2012-11-29 (×3): via INTRAVENOUS

## 2012-11-26 MED ORDER — ONDANSETRON HCL 4 MG/2ML IJ SOLN: 4.0000 mg | Freq: Four times a day (QID) | INTRAMUSCULAR | Status: DC | PRN

## 2012-11-26 MED ORDER — AMLODIPINE BESYLATE 5 MG PO TABS
5.0000 mg | ORAL_TABLET | Freq: Every day | ORAL | Status: DC
  Administered 2012-11-26 – 2012-11-27 (×2): 5 mg via ORAL
  Filled 2012-11-26 (×4): qty 1

## 2012-11-26 MED ORDER — ACETAMINOPHEN 325 MG PO TABS
650.0000 mg | ORAL_TABLET | Freq: Four times a day (QID) | ORAL | Status: DC | PRN
  Administered 2012-11-27 – 2012-12-03 (×2): 650 mg via ORAL
  Filled 2012-11-26 (×2): qty 2

## 2012-11-26 MED ORDER — VANCOMYCIN HCL IN DEXTROSE 1-5 GM/200ML-% IV SOLN
1000.0000 mg | INTRAVENOUS | Status: DC
  Filled 2012-11-26: qty 200

## 2012-11-26 MED ORDER — ONDANSETRON HCL 4 MG/2ML IJ SOLN: 4.0000 mg | Freq: Three times a day (TID) | INTRAMUSCULAR | Status: DC | PRN

## 2012-11-26 MED ORDER — GUAIFENESIN ER 600 MG PO TB12
600.0000 mg | ORAL_TABLET | Freq: Two times a day (BID) | ORAL | Status: DC
  Administered 2012-11-26 – 2012-11-28 (×5): 600 mg via ORAL
  Filled 2012-11-26 (×6): qty 1

## 2012-11-26 MED ORDER — MORPHINE SULFATE 2 MG/ML IJ SOLN
0.5000 mg | INTRAMUSCULAR | Status: DC | PRN
  Administered 2012-11-26: 0.5 mg via INTRAVENOUS
  Filled 2012-11-26: qty 1

## 2012-11-26 MED ORDER — FLEET ENEMA 7-19 GM/118ML RE ENEM: 1.0000 | ENEMA | Freq: Once | RECTAL | Status: AC | PRN

## 2012-11-26 MED ORDER — ALBUTEROL SULFATE (5 MG/ML) 0.5% IN NEBU: 2.5000 mg | INHALATION_SOLUTION | RESPIRATORY_TRACT | Status: DC | PRN

## 2012-11-26 MED ORDER — LATANOPROST 0.005 % OP SOLN
1.0000 [drp] | Freq: Every day | OPHTHALMIC | Status: DC
  Administered 2012-11-26 – 2012-12-04 (×9): 1 [drp] via OPHTHALMIC
  Filled 2012-11-26: qty 2.5

## 2012-11-26 MED ORDER — HYDROMORPHONE HCL PF 1 MG/ML IJ SOLN
0.5000 mg | INTRAMUSCULAR | Status: DC | PRN
  Administered 2012-11-26 – 2012-11-27 (×2): 0.5 mg via INTRAVENOUS
  Filled 2012-11-26 (×2): qty 1

## 2012-11-26 MED ORDER — SENNOSIDES-DOCUSATE SODIUM 8.6-50 MG PO TABS: 1.0000 | ORAL_TABLET | Freq: Every evening | ORAL | Status: DC | PRN

## 2012-11-26 MED ORDER — CYCLOSPORINE 0.05 % OP EMUL
1.0000 [drp] | Freq: Two times a day (BID) | OPHTHALMIC | Status: DC
  Administered 2012-11-26 – 2012-11-29 (×7): 1 [drp] via OPHTHALMIC
  Filled 2012-11-26 (×8): qty 1

## 2012-11-26 NOTE — H&P (Signed)
[facility] History and Physical  Anthony Leon BMW:[id number] DOB: 05-04-1918 DOA: 11/26/2012  Referring physician:  PCP: No primary provider on file.  Specialists:   Chief Complaint: hip fx  HPI: Anthony Leon is a very pleasant somewhat demented moderately anxious 79 y.o. male who is also a world war 2 veteran with a past medical history that includes hypertension, CHF, anxiety, BPH, stroke in 2006, glaucoma presents to the emergency department after a mechanical fall with the chief complaint of hip pain. Information is obtained from the patient and the sister and the safety sitter at the bedside. [facility] and has 24-hour safety sitter's do to his frequent agitation and unsteady gait. According to the safety sitter he woke up in the middle of the night was confused about his surroundings attempted to walk and fell onto his buttocks. There was no head injury no loss of consciousness. He immediately began to complain of hip pain. Of note patient's baseline is mostly wheelchair-bound incontinent of bladder and bowel do to urgency he can feed himself and usually tell you what he needs and wants. There is no report of recent fever, nausea vomiting, dysuria hematuria. The patient's sister does indicate that his appetite has diminished over the last several weeks and that he has lost a total of 5 pounds over the last month unintentionally. In addition he has a tendency towards loose stool but in the usual amounts. No report of melena. Sister also indicates the patient uses nebulizer treatments for intermittent coughing that he's had since his pneumonia in April. She denies any chronic shortness of breath. Patient denies any chest pain palpitation headache visual disturbances. Lab work in the emergency department significant for hemoglobin of 12.2 platelets 148 otherwise unremarkable. Chest x-ray yields no evidence of cardiopulmonary disease. Right hip x-ray shows nondisplaced  intertrochanteric right femur fracture. [facility] are asked to admit. Emergency department physician has contacted Dr. Romeo Leon with orthopedic surgery for consultation.   Review of Systems: 10 point review of systems complete and all are negative except as indicated in the history of present illness.  Past Medical History  Diagnosis Date  . CHF (congestive heart failure)   . Glaucoma   . Hypertension   . Urinary retention   . Agitation   . Confusion   . DVT (deep venous thrombosis)   . BPH (benign prostatic hyperplasia)   . Delirium   . CVA (cerebral infarction)    Past Surgical History  Procedure Laterality Date  . Foot fracture surgery    . Left femur fracture repair     Social History:  reports that he has never smoked. He has never used smokeless tobacco. He reports that he does not drink alcohol or use illicit drugs. [facility] is a [facility] of [facility] and has been since his hip fracture in 2011. He is a widow with no children. He is a retired Arboriculturist. He has one sister who is his power of attorney. He has 24-hour safety sitter's at [facility]. He is a World War II veteran was in [employer] from 1941 and 1944  Allergies  Allergen Reactions  . Ciprofloxacin   . Penicillins   . Tetracyclines & Related   . Tramadol     History reviewed. No pertinent family history. he has 4 siblings his collective health history is positive for breast cancer colon cancer and kidney failure. His mother deceased at 65 from a stroke father  deceased at 70 from an MI  Prior to Admission medications   Medication Sig Start Date End Date Taking? Authorizing Provider  acetaminophen (TYLENOL) 500 MG tablet Take 1,000 mg by mouth 2 (two) times daily.   Yes Historical Provider, MD  albuterol (PROVENTIL) (2.5 MG/3ML) 0.083% nebulizer solution Take 2.5 mg by nebulization every 6 (six) hours as needed for wheezing.   Yes Historical Provider, MD  amLODipine (NORVASC) 5 MG  tablet Take 5 mg by mouth daily.   Yes Historical Provider, MD  bimatoprost (LUMIGAN) 0.01 % SOLN Apply 1 drop to eye at bedtime.   Yes Historical Provider, MD  calcium-vitamin D (OSCAL WITH D) 500-200 MG-UNIT per tablet Take 1 tablet by mouth daily.   Yes Historical Provider, MD  clonazePAM (KLONOPIN) 0.5 MG tablet Take one tablet by mouth every evening for anxiety 09/04/12  Yes Kimber Relic, MD  cycloSPORINE (RESTASIS) 0.05 % ophthalmic emulsion Place 1 drop into both eyes 2 (two) times daily.   Yes Historical Provider, MD  dipyridamole-aspirin (AGGRENOX) 25-200 MG per 12 hr capsule Take 1 capsule by mouth 2 (two) times daily.   Yes Historical Provider, MD  divalproex (DEPAKOTE) 125 MG DR tablet Take 125 mg by mouth 3 (three) times daily.   Yes Historical Provider, MD  fluticasone (FLONASE) 50 MCG/ACT nasal spray Place 2 sprays into the nose daily.   Yes Historical Provider, MD  guaiFENesin (MUCINEX) 600 MG 12 hr tablet Take 600 mg by mouth 2 (two) times daily.   Yes Historical Provider, MD  metoprolol tartrate (LOPRESSOR) 25 MG tablet Take 12.5 mg by mouth 2 (two) times daily.   Yes Historical Provider, MD  Nutritional Supplements (ENSURE PO) Take by mouth.   Yes Historical Provider, MD  Polyethyl Glycol-Propyl Glycol (SYSTANE OP) Apply to eye.   Yes Historical Provider, MD  risperiDONE (RISPERDAL) 0.25 MG tablet Take 0.25 mg by mouth 2 (two) times daily.   Yes Historical Provider, MD  sodium chloride (OCEAN) 0.65 % nasal spray Place 2 sprays into the nose 2 (two) times daily.   Yes Historical Provider, MD  Tamsulosin HCl (FLOMAX) 0.4 MG CAPS Take 0.4 mg by mouth daily.   Yes Historical Provider, MD  trypsin-balsam-castor oil (XENADERM) ointment Apply topically as needed for wound care.   Yes Historical Provider, MD  ALPRAZolam (XANAX) 0.25 MG tablet Take 0.125 mg by mouth at bedtime as needed. For anxiety    Historical Provider, MD  furosemide (LASIX) 20 MG tablet Take 2 tablets (40 mg total)  by mouth daily. 06/22/11   Nimish Normajean Glasgow, MD  ipratropium-albuterol (DUONEB) 0.5-2.5 (3) MG/3ML SOLN Take 3 mLs by nebulization every 6 (six) hours as needed.    Historical Provider, MD  LORazepam (ATIVAN) 0.5 MG tablet Take one tablet by mouth twice a day as needed for anxiety/agitation. 07/25/12   Kimber Relic, MD  potassium chloride SA (K-DUR,KLOR-CON) 20 MEQ tablet Take 2 tablets (40 mEq total) by mouth daily. 06/22/11 06/21/12  Nimish Normajean Glasgow, MD   Physical Exam: Filed Vitals:   11/26/12 0848  BP: 147/59  Pulse: 88  Temp:   Resp: 20     General:  Somewhat pale somewhat frail thin no acute distress  Eyes: PE RRL, EOMI, no scleral icterus. Small amount of greenish purulent drainage from left eye  ENT: Ears clear nose without drainage oropharynx without erythema or exudate. Mucous membranes of his mouth slightly pale very dry  Neck: Supple no JVD full range of motion  Cardiovascular: Regular rate and rhythm no murmur gallop or rub trace lower extremity edema pedal pulses present and palpable  Respiratory: Normal effort breath sounds are clear to auscultation bilaterally no wheeze no rhonchi  Abdomen: Flat soft positive bowel sounds throughout nontender to palpation no mass organomegaly noted  Skin: Slightly pale warm and dry no rash noted, left and right buttocks with erythema small breakdown just distal to the sacral area.  Musculoskeletal: Joints without swelling/erythema. Tenderness to right hip with range of motion. Right foot with slight external rotation.  Psychiatric: Calm teary-eyed slightly anxious  Neurologic:  Oriented to self only. Will follow commands. Will answer questions directly however information may be unreliable due to the patient's dementia  Labs on Admission:  Basic Metabolic Panel:  Recent Labs Lab 11/26/12 0603  NA 138  K 4.0  CL 104  CO2 27  GLUCOSE 97  BUN 14  CREATININE 1.22  CALCIUM 8.8   Liver Function Tests: No results found for  this basename: AST, ALT, ALKPHOS, BILITOT, PROT, ALBUMIN,  in the last 168 hours No results found for this basename: LIPASE, AMYLASE,  in the last 168 hours No results found for this basename: AMMONIA,  in the last 168 hours CBC:  Recent Labs Lab 11/26/12 0603  WBC 5.8  NEUTROABS 2.8  HGB 12.2*  HCT 37.1*  MCV 95.4  PLT 148*   Cardiac Enzymes: No results found for this basename: CKTOTAL, CKMB, CKMBINDEX, TROPONINI,  in the last 168 hours  BNP (last 3 results) No results found for this basename: PROBNP,  in the last 8760 hours CBG: No results found for this basename: GLUCAP,  in the last 168 hours  Radiological Exams on Admission: Dg Hip Bilateral W/pelvis  11/26/2012   CLINICAL DATA:  Right-sided head pain after fall.  EXAM: BILATERAL HIP WITH PELVIS - 4+ VIEW  COMPARISON:  09/03/2009.  FINDINGS: There is an oblique lucency extending from the greater trochanter cortex towards the lesser trochanter. The right hip is located.  Remote intertrochanteric left femur fracture status post ORIF. No periprosthetic fracture or other adverse feature.  No evidence of pelvic ring fracture.  Osteopenia.  IMPRESSION: Nondisplaced intertrochanteric right femur fracture.   Electronically Signed   By: Tiburcio Pea   On: 11/26/2012 05:33   Dg Chest Port 1 View  11/26/2012   CLINICAL DATA:  Fall with hip fracture.  EXAM: PORTABLE CHEST - 1 VIEW  COMPARISON:  08/24/2012.  FINDINGS: Diffuse calcified pleural plaques. When accounting for these extensive opacities, no definite effusion, infiltrate, edema, or pneumothorax. Mild, chronic cardiomegaly. Aortic atherosclerosis without acute upper mediastinal contour abnormality.  No definite fracture. Osteopenia.  IMPRESSION: 1. No evidence of acute cardiopulmonary disease. 2. Extensive bilateral calcified pleural plaque.   Electronically Signed   By: Tiburcio Pea   On: 11/26/2012 06:00    EKG: Independently reviewed. Normal sinus rhythm with frequent  PVCs  Assessment/Plan Principal Problem:   Fx intertrochanteric hip: Do to mechanical fall according to safety sitter report. Admit to medical floor. Emergency room physician has contacted Dr. Tiburcio Pea and with orthopedics who will consult. Unclear whether patient will be scheduled for surgery today. Will hold brother not and keep him n.p.o. except for meds until surgery is scheduled. Patient is on low-dose beta blocker at home but we will continue with parameters. Provide pain medication Active Problems:     CHF, chronic: Last echo in 2011 with an ejection fraction of 55%. At time of admission there is no sign  of decompensation. Will hold his Lasix for now. Will continue his low-dose beta blocker    BPH (benign prostatic hyperplasia): At baseline. Will provide Foley catheter as I want to keep a close eye on his urine output and his volume status. He is typically incontinent at baseline. Continue Flomax     CVA (cerebral infarction): 2006. No deficits. Patient on Aggrenox. Will hold for now    HTN (hypertension): Currently controlled. Systolic blood pressure range [id number]. Continue his low dose beta blocker. Will hold his Lasix and Norvasc for now. Of note medication list includes Lasix however nursing home note dated June of this year indicates patient was not on Lasix we'll clarify    Anxiety state, unspecified: Appears to be at baseline.    Dementia with behavioral disturbance: Of note patient recently started on Risperdal. He is also on Klonopin and Xanax and Ativan on a when necessary basis. Will hold Risperdal for now    Skin breakdown: Patient mostly wheelchair-bound. Will implement appropriate skin care treatment.    Dr. Romeo Leon with orthopedic surgery  Code Status: DO NOT RESUSCITATE confirmed by his sister who is at the bedside and serves as his health care power of attorney Family Communication: Sister at bedside Disposition Plan: [facility] when ready  Time spent: 60  minutes  Gwenyth Bender [facility] Pager [phone]  If 7PM-7AM, please contact night-coverage [email or web address] Password [email or web address] 11/26/2012, 9:01 AM

## 2012-11-26 NOTE — H&P (Signed)
Patient seen and examined. Agree with note as per Toya Smothers, NP.  Patient has been admitted with hip fracture from mechanical fall.  He is a resident of a nursing facility.  CHF appears to be compensated. He would be considered moderate risk for surgery.  He has been taking aggrenox so surgery will be delayed 48 hours.  Dr. Romeo Apple following.  Madelyne Millikan

## 2012-11-26 NOTE — Clinical Social Work Note (Signed)
Clinical Social Work Department BRIEF PSYCHOSOCIAL ASSESSMENT 11/26/2012  Patient:  Anthony Leon, Anthony Leon     Account Number:  [id number]     Admit date:  11/26/2012  Clinical Social Worker:  Sherrlyn Hock  Date/Time:  11/26/2012 01:50 PM  Referred by:  CSW  Date Referred:  11/26/2012 Referred for  SNF Placement   Other Referral:   Interview type:  Patient Other interview type:    PSYCHOSOCIAL DATA Living Status:  FACILITY Admitted from facility:  [facility] Level of care:  Skilled Nursing Facility Primary support name:  Joann Primary support relationship to patient:  SIBLING Degree of support available:   supportive    CURRENT CONCERNS Current Concerns  Post-Acute Placement   Other Concerns:    SOCIAL WORK ASSESSMENT / PLAN CSW met with pt at bedside. Pt pleasantly confused. Sitter in room with pt reports that he is a resident at [facility] and has around the clock sitters there. Left voicemail for pt's sister requesting return call. Pt said he has three sisters who live locally and check on him. Per Tami at [facility], pt fell trying to get out of bed. He is a long term, private pay resident there. Pt will be bed hold. Awaiting ortho consult for surgery. Pt has [employer] and CSW will initiate authorization for SNF when appropriate after surgery.   Assessment/plan status:  Psychosocial Support/Ongoing Assessment of Needs Other assessment/ plan:   Information/referral to community resources:   [facility]    PATIENT'S/FAMILY'S RESPONSE TO PLAN OF CARE: Pt unable to discuss plan of care at this time. Awaiting return call from pt's sister. Anticipate return to [facility] when stable after surgery. CSW will continue to follow.       Derenda Fennel, [place] [phone]

## 2012-11-26 NOTE — ED Provider Notes (Signed)
CSN: [id number]     Arrival date & time 11/26/12  0441 History   First MD Initiated Contact with Patient 11/26/12 0447     Chief Complaint  Patient presents with  . Fall   (Consider location/radiation/quality/duration/timing/severity/associated sxs/prior Treatment) Patient is a 79 y.o. male presenting with fall. The history is provided by the patient and the nursing home. The history is limited by the condition of the patient (Dementia).  Fall  He apparently woke up from a dream and was confused and got up and tried to walk and fell on his buttocks. There is no known loss of consciousness. He is complaining of pain in his buttock and in his hips.  Past Medical History  Diagnosis Date  . CHF (congestive heart failure)   . Glaucoma   . Hypertension   . Urinary retention   . Agitation   . Confusion   . DVT (deep venous thrombosis)   . BPH (benign prostatic hyperplasia)   . Delirium   . CVA (cerebral infarction)    Past Surgical History  Procedure Laterality Date  . Foot fracture surgery    . Left femur fracture repair     History reviewed. No pertinent family history. History  Substance Use Topics  . Smoking status: Never Smoker   . Smokeless tobacco: Never Used  . Alcohol Use: No    Review of Systems  Unable to perform ROS: Dementia    Allergies  Ciprofloxacin; Penicillins; Tetracyclines & related; and Tramadol  Home Medications   Current Outpatient Rx  Name  Route  Sig  Dispense  Refill  . acetaminophen (TYLENOL) 500 MG tablet   Oral   Take 1,000 mg by mouth 2 (two) times daily.         Marland Kitchen ALPRAZolam (XANAX) 0.25 MG tablet   Oral   Take 0.125 mg by mouth at bedtime as needed. For anxiety         . amLODipine (NORVASC) 5 MG tablet   Oral   Take 5 mg by mouth daily.         . bimatoprost (LUMIGAN) 0.01 % SOLN   Ophthalmic   Apply 1 drop to eye at bedtime.         . calcium-vitamin D (OSCAL WITH D) 500-200 MG-UNIT per tablet   Oral   Take 1  tablet by mouth daily.         . clonazePAM (KLONOPIN) 0.5 MG tablet      Take one tablet by mouth every evening for anxiety   30 tablet   5   . dipyridamole-aspirin (AGGRENOX) 25-200 MG per 12 hr capsule   Oral   Take 1 capsule by mouth 2 (two) times daily.         . fluticasone (FLONASE) 50 MCG/ACT nasal spray   Nasal   Place 2 sprays into the nose daily.         . furosemide (LASIX) 20 MG tablet   Oral   Take 2 tablets (40 mg total) by mouth daily.   30 tablet   0   . ipratropium-albuterol (DUONEB) 0.5-2.5 (3) MG/3ML SOLN   Nebulization   Take 3 mLs by nebulization every 6 (six) hours as needed.         Marland Kitchen LORazepam (ATIVAN) 0.5 MG tablet      Take one tablet by mouth twice a day as needed for anxiety/agitation.   60 tablet   3   . metoprolol tartrate (LOPRESSOR) 25 MG tablet  Oral   Take 12.5 mg by mouth 2 (two) times daily.         Marland Kitchen EXPIRED: potassium chloride SA (K-DUR,KLOR-CON) 20 MEQ tablet   Oral   Take 2 tablets (40 mEq total) by mouth daily.   60 tablet   0   . sodium chloride (OCEAN) 0.65 % nasal spray   Nasal   Place 2 sprays into the nose 2 (two) times daily.         . Tamsulosin HCl (FLOMAX) 0.4 MG CAPS   Oral   Take 0.4 mg by mouth daily.          BP 144/53  Pulse 80  Temp(Src) 98.6 F (37 C) (Oral)  Resp 18  Ht 5\' 11"  (1.803 m)  Wt 185 lb (83.915 kg)  BMI 25.81 kg/m2  SpO2 98% Physical Exam  Nursing note and vitals reviewed.  79 year old male, resting comfortably and in no acute distress. Vital signs are significant for hypertension with blood pressure 100/44 vertebrae. Oxygen saturation is 98%, which is normal. Head is normocephalic and atraumatic. PERRLA, EOMI. Oropharynx is clear. Neck is nontender and supple without adenopathy or JVD. Back is nontender and there is no CVA tenderness. Lungs are clear without rales, wheezes, or rhonchi. Chest is nontender. Heart has regular rate and rhythm without murmur. Abdomen  is soft, flat, nontender without masses or hepatosplenomegaly and peristalsis is normoactive. Extremities have no cyanosis or edema, full range of motion is present. Mild pain is elicited with range of motion of the right hip. Skin is warm and dry without rash. Neurologic: He is awake and alert, oriented to person and place but not time, cranial nerves are intact, there are no motor or sensory deficits.  ED Course  Procedures (including critical care time) Labs Review Results for orders placed during the hospital encounter of 11/26/12  CBC WITH DIFFERENTIAL      Result Value Range   WBC 5.8  4.0 - 10.5 K/uL   RBC 3.89 (*) 4.22 - 5.81 MIL/uL   Hemoglobin 12.2 (*) 13.0 - 17.0 g/dL   HCT 19.1 (*) 47.8 - 29.5 %   MCV 95.4  78.0 - 100.0 fL   MCH 31.4  26.0 - 34.0 pg   MCHC 32.9  30.0 - 36.0 g/dL   RDW 62.1  30.8 - 65.7 %   Platelets 148 (*) 150 - 400 K/uL   Neutrophils Relative % 49  43 - 77 %   Neutro Abs 2.8  1.7 - 7.7 K/uL   Lymphocytes Relative 39  12 - 46 %   Lymphs Abs 2.3  0.7 - 4.0 K/uL   Monocytes Relative 10  3 - 12 %   Monocytes Absolute 0.6  0.1 - 1.0 K/uL   Eosinophils Relative 2  0 - 5 %   Eosinophils Absolute 0.1  0.0 - 0.7 K/uL   Basophils Relative 0  0 - 1 %   Basophils Absolute 0.0  0.0 - 0.1 K/uL  BASIC METABOLIC PANEL      Result Value Range   Sodium 138  135 - 145 mEq/L   Potassium 4.0  3.5 - 5.1 mEq/L   Chloride 104  96 - 112 mEq/L   CO2 27  19 - 32 mEq/L   Glucose, Bld 97  70 - 99 mg/dL   BUN 14  6 - 23 mg/dL   Creatinine, Ser 8.46  0.50 - 1.35 mg/dL   Calcium 8.8  8.4 - 96.2 mg/dL  GFR calc non Af Amer 50 (*) >90 mL/min   GFR calc Af Amer 57 (*) >90 mL/min  TYPE AND SCREEN      Result Value Range   ABO/RH(D) O POS     Antibody Screen PENDING     Sample Expiration 11/29/2012     Imaging Review Dg Hip Bilateral W/pelvis  11/26/2012   CLINICAL DATA:  Right-sided head pain after fall.  EXAM: BILATERAL HIP WITH PELVIS - 4+ VIEW  COMPARISON:   09/03/2009.  FINDINGS: There is an oblique lucency extending from the greater trochanter cortex towards the lesser trochanter. The right hip is located.  Remote intertrochanteric left femur fracture status post ORIF. No periprosthetic fracture or other adverse feature.  No evidence of pelvic ring fracture.  Osteopenia.  IMPRESSION: Nondisplaced intertrochanteric right femur fracture.   Electronically Signed   By: Tiburcio Pea   On: 11/26/2012 05:33   Dg Chest Port 1 View  11/26/2012   CLINICAL DATA:  Fall with hip fracture.  EXAM: PORTABLE CHEST - 1 VIEW  COMPARISON:  08/24/2012.  FINDINGS: Diffuse calcified pleural plaques. When accounting for these extensive opacities, no definite effusion, infiltrate, edema, or pneumothorax. Mild, chronic cardiomegaly. Aortic atherosclerosis without acute upper mediastinal contour abnormality.  No definite fracture. Osteopenia.  IMPRESSION: 1. No evidence of acute cardiopulmonary disease. 2. Extensive bilateral calcified pleural plaque.   Electronically Signed   By: Tiburcio Pea   On: 11/26/2012 06:00   Images viewed by me.   Date: 11/26/2012  Rate: 64  Rhythm: normal sinus rhythm and premature ventricular contractions (PVC)  QRS Axis: normal  Intervals: normal  ST/T Wave abnormalities: normal  Conduction Disutrbances:none  Narrative Interpretation: Frequent PVCs, old anteroseptal myocardial infarction. When compared with ECG of 06/16/2011, no significant changes are seen.  Old EKG Reviewed: unchanged   MDM   1. Intertrochanteric fracture of right hip, closed, initial encounter   2. Anemia    Fall without obvious injury. Because of complaints of hip pain, he will be sent for x-rays.  X-rays show nondisplaced intertrochanteric fracture of the right hip. Screening labs are obtained as well as ECG and preop chest x-ray. Case is discussed with Dr. Romeo Apple who agrees to see the patient in consultation for orthopedic management. Case is discussed with  [facility] who agrees to admit the patient.  Dione Booze, MD 11/26/12 [phone]

## 2012-11-26 NOTE — Clinical Social Work Note (Signed)
CSW received call from pt's sister, Chyrl Civatte who reports she is POA. Pt's wife is deceased and he has no children. Chyrl Civatte said pt has been more agitated and confused the last few days. He is primarily in a wheelchair. Family has been very pleased with care at [facility]. He has been a resident there since May 2011 and has had around the clock sitters the whole time. Joann plans to go by [facility] today to complete bed hold.   Derenda Fennel, [place] [phone]

## 2012-11-26 NOTE — ED Notes (Signed)
Pt is from the [facility]. Safety sitter states pt got up out of bed and went to get hiss walker. Pt took 2 steps and fell on his bottom. Pt c/o right sided hip pain.

## 2012-11-27 ENCOUNTER — Inpatient Hospital Stay (HOSPITAL_COMMUNITY): Payer: Medicare PPO

## 2012-11-27 DIAGNOSIS — F0390 Unspecified dementia without behavioral disturbance: Secondary | ICD-10-CM

## 2012-11-27 DIAGNOSIS — F05 Delirium due to known physiological condition: Secondary | ICD-10-CM

## 2012-11-27 LAB — URINALYSIS, ROUTINE W REFLEX MICROSCOPIC
Glucose, UA: NEGATIVE mg/dL
pH: 5.5 (ref 5.0–8.0)

## 2012-11-27 LAB — URINE MICROSCOPIC-ADD ON

## 2012-11-27 LAB — CBC
HCT: 34.9 % — ABNORMAL LOW (ref 39.0–52.0)
MCV: 96.1 fL (ref 78.0–100.0)
RDW: 14.1 % (ref 11.5–15.5)
WBC: 7 10*3/uL (ref 4.0–10.5)

## 2012-11-27 LAB — BASIC METABOLIC PANEL
CO2: 25 mEq/L (ref 19–32)
Chloride: 106 mEq/L (ref 96–112)
Creatinine, Ser: 1.26 mg/dL (ref 0.50–1.35)

## 2012-11-27 LAB — ABO/RH: ABO/RH(D): O POS

## 2012-11-27 LAB — PREPARE RBC (CROSSMATCH)

## 2012-11-27 MED ORDER — HYDROMORPHONE HCL PF 1 MG/ML IJ SOLN
0.5000 mg | INTRAMUSCULAR | Status: DC | PRN
  Administered 2012-11-27: 0.5 mg via INTRAVENOUS
  Filled 2012-11-27: qty 1

## 2012-11-27 MED ORDER — HYDROCODONE-ACETAMINOPHEN 5-325 MG PO TABS
1.0000 | ORAL_TABLET | ORAL | Status: DC | PRN
  Administered 2012-11-27: 1 via ORAL
  Filled 2012-11-27: qty 1

## 2012-11-27 MED ORDER — NALOXONE HCL 0.4 MG/ML IJ SOLN
0.4000 mg | Freq: Once | INTRAMUSCULAR | Status: AC
  Administered 2012-11-27: 0.4 mg via INTRAVENOUS
  Filled 2012-11-27: qty 1

## 2012-11-27 MED ORDER — VANCOMYCIN HCL IN DEXTROSE 1-5 GM/200ML-% IV SOLN
INTRAVENOUS | Status: AC
  Filled 2012-11-27: qty 200

## 2012-11-27 MED ORDER — HYDROMORPHONE HCL PF 1 MG/ML IJ SOLN
0.5000 mg | INTRAMUSCULAR | Status: DC | PRN
  Administered 2012-11-28 (×3): 0.5 mg via INTRAVENOUS
  Filled 2012-11-27 (×3): qty 1

## 2012-11-27 MED ORDER — VANCOMYCIN HCL IN DEXTROSE 1-5 GM/200ML-% IV SOLN
1000.0000 mg | INTRAVENOUS | Status: AC
  Administered 2012-11-28: 1000 mg via INTRAVENOUS
  Filled 2012-11-27: qty 200

## 2012-11-27 MED ORDER — SULFAMETHOXAZOLE-TRIMETHOPRIM 400-80 MG/5ML IV SOLN
15.0000 mg/kg/d | Freq: Four times a day (QID) | INTRAVENOUS | Status: DC
  Administered 2012-11-27 – 2012-11-29 (×7): 294.4 mg via INTRAVENOUS
  Filled 2012-11-27 (×12): qty 18.4

## 2012-11-27 NOTE — Care Management Note (Addendum)
    Page 1 of 1   12/04/2012     3:00:44 PM   CARE MANAGEMENT NOTE 12/04/2012  Patient:  RAHSAAN, WEAKLAND   Account Number:  [id number]  Date Initiated:  11/27/2012  Documentation initiated by:  Rosemary Holms  Subjective/Objective Assessment:   Pt admitted after a mechnaical fall with fx hip. Pt from [facility] and will return when medically stable.     Action/Plan:   Anticipated DC Date:  20-Dec-2012   Anticipated DC Plan:  SKILLED NURSING FACILITY  In-house referral  Clinical Social Worker      DC Planning Services  CM consult      Choice offered to / List presented to:             Status of service:  Completed, signed off Medicare Important Message given?  YES (If response is "NO", the following Medicare IM given date fields will be blank) Date Medicare IM given:  12/04/2012 Date Additional Medicare IM given:    Discharge Disposition:  SKILLED NURSING FACILITY  Per UR Regulation:    If discussed at Long Length of Stay Meetings, dates discussed:   12/04/2012    Comments:  12/04/12 Rosemary Holms RN BSN CM Sitter at bedside. Discussed IM and left a copy at bedside with CM contact informtion  11/30/12 1510 Arlyss Queen, RN BSN CM Pt more drowsy today. PT unable to assess pt post op at this time. CSW to arrange discharge to [facility] when medically stable.  11/27/12 Rosemary Holms RN BSN CM

## 2012-11-27 NOTE — Progress Notes (Signed)
Patient seen and examined. Agree with note as above per Toya Smothers, NP.  He's been admitted with a hip fracture. Plans are for operative repair. Surgery was delayed due to patient being on Aggrenox prior to admission. Tentative plans are for surgery tomorrow. Today, the patient was slightly more confused. He was noted to be febrile. Urinalysis indicates a possible infection. He's been started on Bactrim due to his allergies to penicillin as well as ciprofloxacin. Bactrim is being given IV due to his alteration in mental status. When his mental status has improved, this may be transitioned to by mouth. Congestive heart failure currently compensated. Chest x-ray does not show any change from prior study. He will need to return to the [facility] on discharge.  Dashley Monts

## 2012-11-27 NOTE — Progress Notes (Signed)
[facility] PROGRESS NOTE  Anthony Leon JYN:[id number] DOB: 1918-11-23 DOA: 11/26/2012 PCP: No primary provider on file.  Assessment/Plan: Fx intertrochanteric hip: Do to mechanical fall according to safety sitter report. Surgery hopefully tomorrow. Delayed due to pt being on aggrenox prior to admission. Continue low-dose beta blocker with parameters. Provide pain medication. Pt somewhat drowsy this am. Will decrease pain med slightly Active Problems:  CHF, chronic: Last echo in 2011 with an ejection fraction of 55%. Continue to  hold his Lasix for now. Will continue his low-dose beta blocker . Volume status - . Appears slightly dry. Will increase IV fluid to 75/hr. Continue intake and output and daily weight.   BPH (benign prostatic hyperplasia): currently with foley. He is typically incontinent at baseline. Continue Flomax   CVA (cerebral infarction): 2006. No deficits. Patient on Aggrenox prior to admission. Continue to hold in anticipation for surgery.    HTN (hypertension): Currently controlled. Continue his low dose beta blocker and norvasc. Will continue to hold his Lasix.   Anxiety state, unspecified: Lethargic this am. Will adjust pain med. Spoke to [facility] and they said pt not taking klonopin. Will discontinue and provide prn xanax.   Dementia with behavioral disturbance: Of note patient recently started on Risperdal. Continue to hold for now  Skin breakdown: Patient mostly wheelchair-bound. Will implement appropriate skin care treatment.    Code Status: DMR Family Communication: none available. Safety sitter at bedside Disposition Plan: [facility]   Consultants:  orthopedics  Procedures:  none  Antibiotics:  none  HPI/Subjective: Lying in bed eyes closed. Responds to light touch. lethargic  Objective: Filed Vitals:   11/27/12 0433  BP: 123/70  Pulse: 84  Temp: 98.7 F (37.1 C)  Resp: 20    Intake/Output Summary (Last 24 hours) at  11/27/12 0921 Last data filed at 11/27/12 0349  Gross per 24 hour  Intake    350 ml  Output    450 ml  Net   -100 ml   Filed Weights   11/26/12 0442 11/27/12 0433  Weight: 83.915 kg (185 lb) 78.6 kg (173 lb 4.5 oz)    Exam:   General:  Frail NAD  Cardiovascular: RRR No MGR No LE edema PPP  Respiratory: normal effort somewhat shallow. BS coarse bilaterally no wheeze no crackles  Abdomen: flat soft +BS non-tender to palpation  Musculoskeletal: right leg slight external rotation. Right hip very tender to touch. Decreased rom due to pain.    Data Reviewed: Basic Metabolic Panel:  Recent Labs Lab 11/26/12 0603 11/27/12 0555  NA 138 137  K 4.0 4.3  CL 104 106  CO2 27 25  GLUCOSE 97 102*  BUN 14 21  CREATININE 1.22 1.26  CALCIUM 8.8 8.5   Liver Function Tests: No results found for this basename: AST, ALT, ALKPHOS, BILITOT, PROT, ALBUMIN,  in the last 168 hours No results found for this basename: LIPASE, AMYLASE,  in the last 168 hours No results found for this basename: AMMONIA,  in the last 168 hours CBC:  Recent Labs Lab 11/26/12 0603 11/27/12 0555  WBC 5.8 7.0  NEUTROABS 2.8  --   HGB 12.2* 11.5*  HCT 37.1* 34.9*  MCV 95.4 96.1  PLT 148* 128*   Cardiac Enzymes: No results found for this basename: CKTOTAL, CKMB, CKMBINDEX, TROPONINI,  in the last 168 hours BNP (last 3 results) No results found for this basename: PROBNP,  in the last 8760 hours CBG: No results found for this basename: GLUCAP,  in the last 168 hours  Recent Results (from the past 240 hour(s))  SURGICAL PCR SCREEN     Status: None   Collection Time    11/26/12  3:30 PM      Result Value Range Status   MRSA, PCR NEGATIVE  NEGATIVE Final   Staphylococcus aureus NEGATIVE  NEGATIVE Final   Comment:            The Xpert SA Assay (FDA     approved for NASAL specimens     in patients over 77 years of age),     is one component of     a comprehensive surveillance     program.  Test  performance has     been validated by [employer] for patients greater     than or equal to 21 year old.     It is not intended     to diagnose infection nor to     guide or monitor treatment.     Studies: Dg Hip Bilateral W/pelvis  11/26/2012   CLINICAL DATA:  Right-sided head pain after fall.  EXAM: BILATERAL HIP WITH PELVIS - 4+ VIEW  COMPARISON:  09/03/2009.  FINDINGS: There is an oblique lucency extending from the greater trochanter cortex towards the lesser trochanter. The right hip is located.  Remote intertrochanteric left femur fracture status post ORIF. No periprosthetic fracture or other adverse feature.  No evidence of pelvic ring fracture.  Osteopenia.  IMPRESSION: Nondisplaced intertrochanteric right femur fracture.   Electronically Signed   By: Tiburcio Pea   On: 11/26/2012 05:33   Dg Chest Port 1 View  11/26/2012   CLINICAL DATA:  Fall with hip fracture.  EXAM: PORTABLE CHEST - 1 VIEW  COMPARISON:  08/24/2012.  FINDINGS: Diffuse calcified pleural plaques. When accounting for these extensive opacities, no definite effusion, infiltrate, edema, or pneumothorax. Mild, chronic cardiomegaly. Aortic atherosclerosis without acute upper mediastinal contour abnormality.  No definite fracture. Osteopenia.  IMPRESSION: 1. No evidence of acute cardiopulmonary disease. 2. Extensive bilateral calcified pleural plaque.   Electronically Signed   By: Tiburcio Pea   On: 11/26/2012 06:00    Scheduled Meds: . amLODipine  5 mg Oral Daily  . chlorhexidine  60 mL Topical Once  . clonazePAM  0.25 mg Oral QHS  . cycloSPORINE  1 drop Both Eyes BID  . divalproex  125 mg Oral TID  . guaiFENesin  600 mg Oral BID  . influenza vac split quadrivalent PF  0.5 mL Intramuscular Tomorrow-1000  . latanoprost  1 drop Left Eye QHS  . metoprolol tartrate  12.5 mg Oral BID  . tamsulosin  0.4 mg Oral q1800  . vancomycin  1,000 mg Intravenous On Call to OR   Continuous Infusions: . sodium chloride 50  mL/hr (11/26/12 1433)    Principal Problem:   Fx intertrochanteric hip Active Problems:   CHF, chronic   BPH (benign prostatic hyperplasia)   CVA (cerebral infarction)   HTN (hypertension)   Anxiety state, unspecified   Dementia with behavioral disturbance   Skin breakdown    Time spent: 30 minutes    [facility] M  [facility] Pager [phone]. If 7PM-7AM, please contact night-coverage at [email or web address], password [facility] 11/27/2012, 9:21 AM  LOS: 1 day

## 2012-11-27 NOTE — Progress Notes (Signed)
Pt was complaining of pain. Norco was given to pt but 90 minutes later pt was still complaining of pain. Administered 0.5mg  of IV dilaudid. After IV pain med was administered, pt became obtunded and was breathing 8 breaths per minute, with an oxygen saturation of 85% on RA. Put pt on 2L of oxygen via [place] and NP was notified. Order to give 0.4mg  of narcan IV was ordered. Narcanw as administered and pt now alert and breathing 16 breaths per minute with an oxygen saturation of 98% on 2L of [place]. Will continue to monitor.

## 2012-11-27 NOTE — Progress Notes (Signed)
Subjective: Had SOB today  Did better with oxygen    Objective: Vital signs in last 24 hours: Temp:  [98.1 F (36.7 C)-101.4 F (38.6 C)] 99 F (37.2 C) (09/16 1358) Pulse Rate:  [76-84] 76 (09/16 1358) Resp:  [8-20] 18 (09/16 1624) BP: (102-132)/(60-70) 102/60 mmHg (09/16 1358) SpO2:  [85 %-100 %] 98 % (09/16 1624) Weight:  [173 lb 4.5 oz (78.6 kg)] 173 lb 4.5 oz (78.6 kg) (09/16 0433)  Intake/Output from previous day: 09/15 0701 - 09/16 0700 In: 350 [P.O.:350] Out: 450 [Urine:450] Intake/Output this shift: Total I/O In: 200 [P.O.:200] Out: 175 [Urine:175]   Recent Labs  11/26/12 0603 11/27/12 0555  HGB 12.2* 11.5*    Recent Labs  11/26/12 0603 11/27/12 0555  WBC 5.8 7.0  RBC 3.89* 3.63*  HCT 37.1* 34.9*  PLT 148* 128*    Recent Labs  11/26/12 0603 11/27/12 0555  NA 138 137  K 4.0 4.3  CL 104 106  CO2 27 25  BUN 14 21  CREATININE 1.22 1.26  GLUCOSE 97 102*  CALCIUM 8.8 8.5    Recent Labs  11/26/12 1301  INR 1.19   Assessment/Plan: ? Surgery tomorrow  i dont know that he will improve so rec move him to 1210 slot to allow time to c how he does    Fuller Canada 11/27/2012, 6:08 PM

## 2012-11-27 NOTE — Progress Notes (Signed)
Utilization Review Complete  

## 2012-11-28 ENCOUNTER — Encounter (HOSPITAL_COMMUNITY)
Admission: EM | Disposition: A | Discharge status: Skilled Nursing Facility | Payer: Self-pay | Source: Home / Self Care | Attending: Family Medicine

## 2012-11-28 ENCOUNTER — Inpatient Hospital Stay (HOSPITAL_COMMUNITY): Payer: Medicare PPO

## 2012-11-28 ENCOUNTER — Encounter (HOSPITAL_COMMUNITY): Payer: Self-pay | Admitting: *Deleted

## 2012-11-28 ENCOUNTER — Inpatient Hospital Stay (HOSPITAL_COMMUNITY): Payer: Medicare PPO | Admitting: Anesthesiology

## 2012-11-28 ENCOUNTER — Encounter (HOSPITAL_COMMUNITY): Payer: Self-pay | Admitting: Anesthesiology

## 2012-11-28 DIAGNOSIS — D649 Anemia, unspecified: Secondary | ICD-10-CM

## 2012-11-28 DIAGNOSIS — F0391 Unspecified dementia with behavioral disturbance: Secondary | ICD-10-CM

## 2012-11-28 HISTORY — PX: INTRAMEDULLARY (IM) NAIL INTERTROCHANTERIC: SHX5875

## 2012-11-28 LAB — BASIC METABOLIC PANEL
CO2: 25 mEq/L (ref 19–32)
Chloride: 102 mEq/L (ref 96–112)
Creatinine, Ser: 1.34 mg/dL (ref 0.50–1.35)
GFR calc Af Amer: 51 mL/min — ABNORMAL LOW (ref >90)
Sodium: 134 mEq/L — ABNORMAL LOW (ref 135–145)

## 2012-11-28 LAB — CBC
HCT: 31.4 % — ABNORMAL LOW (ref 39.0–52.0)
Hemoglobin: 10.2 g/dL — ABNORMAL LOW (ref 13.0–17.0)
MCHC: 32.5 g/dL (ref 30.0–36.0)
RBC: 3.27 MIL/uL — ABNORMAL LOW (ref 4.22–5.81)
WBC: 7 10*3/uL (ref 4.0–10.5)

## 2012-11-28 LAB — URINE CULTURE: Colony Count: NO GROWTH

## 2012-11-28 SURGERY — FIXATION, FRACTURE, INTERTROCHANTERIC, WITH INTRAMEDULLARY ROD
Anesthesia: Spinal | Site: Hip | Laterality: Right | Wound class: Clean

## 2012-11-28 MED ORDER — DIVALPROEX SODIUM 125 MG PO CPSP
375.0000 mg | ORAL_CAPSULE | Freq: Every day | ORAL | Status: DC
  Filled 2012-11-28 (×2): qty 3

## 2012-11-28 MED ORDER — FENTANYL CITRATE 0.05 MG/ML IJ SOLN
INTRAMUSCULAR | Status: DC | PRN
  Administered 2012-11-28: 25 ug via INTRATHECAL

## 2012-11-28 MED ORDER — POTASSIUM CHLORIDE IN NACL 20-0.9 MEQ/L-% IV SOLN
INTRAVENOUS | Status: DC
  Administered 2012-11-28: 16:00:00 via INTRAVENOUS

## 2012-11-28 MED ORDER — METOCLOPRAMIDE HCL 10 MG PO TABS: 5.0000 mg | ORAL_TABLET | Freq: Three times a day (TID) | ORAL | Status: DC | PRN

## 2012-11-28 MED ORDER — VANCOMYCIN HCL IN DEXTROSE 1-5 GM/200ML-% IV SOLN
INTRAVENOUS | Status: AC
  Filled 2012-11-28: qty 200

## 2012-11-28 MED ORDER — HYDROCODONE-ACETAMINOPHEN 5-325 MG PO TABS: 1.0000 | ORAL_TABLET | Freq: Four times a day (QID) | ORAL | Status: DC | PRN

## 2012-11-28 MED ORDER — DEXTROSE 5 % IV SOLN
INTRAVENOUS | Status: DC | PRN
  Administered 2012-11-28: 13:00:00 via INTRAVENOUS

## 2012-11-28 MED ORDER — RISPERIDONE 0.5 MG PO TABS: 0.2500 mg | ORAL_TABLET | Freq: Every day | ORAL | Status: DC

## 2012-11-28 MED ORDER — GUAIFENESIN ER 600 MG PO TB12: 1200.0000 mg | ORAL_TABLET | Freq: Two times a day (BID) | ORAL | Status: DC | PRN

## 2012-11-28 MED ORDER — VANCOMYCIN HCL IN DEXTROSE 1-5 GM/200ML-% IV SOLN
1000.0000 mg | Freq: Two times a day (BID) | INTRAVENOUS | Status: AC
  Administered 2012-11-28: 1000 mg via INTRAVENOUS
  Filled 2012-11-28: qty 200

## 2012-11-28 MED ORDER — FENTANYL CITRATE 0.05 MG/ML IJ SOLN
INTRAMUSCULAR | Status: DC | PRN
  Administered 2012-11-28: 25 ug via INTRAVENOUS

## 2012-11-28 MED ORDER — FENTANYL CITRATE 0.05 MG/ML IJ SOLN
INTRAMUSCULAR | Status: AC
  Filled 2012-11-28: qty 2

## 2012-11-28 MED ORDER — PHENYLEPHRINE HCL 10 MG/ML IJ SOLN
INTRAMUSCULAR | Status: DC | PRN
  Administered 2012-11-28: 50 ug via INTRAVENOUS
  Administered 2012-11-28: 100 ug via INTRAVENOUS
  Administered 2012-11-28 (×4): 150 ug via INTRAVENOUS
  Administered 2012-11-28: 100 ug via INTRAVENOUS
  Administered 2012-11-28 (×2): 200 ug via INTRAVENOUS
  Administered 2012-11-28: 100 ug via INTRAVENOUS
  Administered 2012-11-28: 150 ug via INTRAVENOUS

## 2012-11-28 MED ORDER — PHENYLEPHRINE HCL 10 MG/ML IJ SOLN
INTRAMUSCULAR | Status: AC
  Filled 2012-11-28: qty 1

## 2012-11-28 MED ORDER — EPHEDRINE SULFATE 50 MG/ML IJ SOLN
INTRAMUSCULAR | Status: AC
  Filled 2012-11-28: qty 1

## 2012-11-28 MED ORDER — BISACODYL 10 MG RE SUPP: 10.0000 mg | Freq: Every day | RECTAL | Status: DC | PRN

## 2012-11-28 MED ORDER — MAGNESIUM CITRATE PO SOLN: 1.0000 | Freq: Once | ORAL | Status: DC | PRN

## 2012-11-28 MED ORDER — POLYVINYL ALCOHOL 1.4 % OP SOLN
1.0000 [drp] | Freq: Three times a day (TID) | OPHTHALMIC | Status: DC
  Administered 2012-11-28 – 2012-12-05 (×20): 1 [drp] via OPHTHALMIC
  Filled 2012-11-28 (×4): qty 15

## 2012-11-28 MED ORDER — BUPIVACAINE-EPINEPHRINE PF 0.5-1:200000 % IJ SOLN
INTRAMUSCULAR | Status: AC
  Filled 2012-11-28: qty 20

## 2012-11-28 MED ORDER — ENSURE COMPLETE PO LIQD
237.0000 mL | Freq: Two times a day (BID) | ORAL | Status: DC
  Administered 2012-12-04: 237 mL via ORAL

## 2012-11-28 MED ORDER — BUPIVACAINE IN DEXTROSE 0.75-8.25 % IT SOLN
INTRATHECAL | Status: AC
  Filled 2012-11-28: qty 2

## 2012-11-28 MED ORDER — POLYETHYLENE GLYCOL 3350 17 G PO PACK
17.0000 g | PACK | Freq: Every day | ORAL | Status: DC
  Administered 2012-12-04: 17 g via ORAL
  Filled 2012-11-28 (×2): qty 1

## 2012-11-28 MED ORDER — ACETAMINOPHEN 650 MG RE SUPP: 650.0000 mg | Freq: Four times a day (QID) | RECTAL | Status: DC | PRN

## 2012-11-28 MED ORDER — PHENYLEPHRINE HCL 10 MG/ML IJ SOLN
30.0000 ug/min | INTRAVENOUS | Status: DC
  Filled 2012-11-28: qty 1

## 2012-11-28 MED ORDER — METOCLOPRAMIDE HCL 5 MG/ML IJ SOLN: 5.0000 mg | Freq: Three times a day (TID) | INTRAMUSCULAR | Status: DC | PRN

## 2012-11-28 MED ORDER — MIDAZOLAM HCL 2 MG/2ML IJ SOLN
1.0000 mg | INTRAMUSCULAR | Status: DC | PRN
  Administered 2012-11-28 (×2): 0.5 mg via INTRAVENOUS

## 2012-11-28 MED ORDER — ALBUTEROL SULFATE (5 MG/ML) 0.5% IN NEBU
2.5000 mg | INHALATION_SOLUTION | RESPIRATORY_TRACT | Status: DC
  Administered 2012-11-28 – 2012-11-30 (×11): 2.5 mg via RESPIRATORY_TRACT
  Filled 2012-11-28 (×11): qty 0.5

## 2012-11-28 MED ORDER — MENTHOL 3 MG MT LOZG
1.0000 | LOZENGE | OROMUCOSAL | Status: DC | PRN
  Filled 2012-11-28: qty 9

## 2012-11-28 MED ORDER — EPHEDRINE SULFATE 50 MG/ML IJ SOLN
INTRAMUSCULAR | Status: DC | PRN
  Administered 2012-11-28: 10 mg via INTRAVENOUS
  Administered 2012-11-28: 15 mg via INTRAVENOUS

## 2012-11-28 MED ORDER — DOCUSATE SODIUM 100 MG PO CAPS
100.0000 mg | ORAL_CAPSULE | Freq: Two times a day (BID) | ORAL | Status: DC
  Administered 2012-11-29 – 2012-12-05 (×6): 100 mg via ORAL
  Filled 2012-11-28 (×8): qty 1

## 2012-11-28 MED ORDER — ALUM & MAG HYDROXIDE-SIMETH 200-200-20 MG/5ML PO SUSP: 30.0000 mL | ORAL | Status: DC | PRN

## 2012-11-28 MED ORDER — PROPOFOL INFUSION 10 MG/ML OPTIME
INTRAVENOUS | Status: DC | PRN
  Administered 2012-11-28: 35 ug/kg/min via INTRAVENOUS

## 2012-11-28 MED ORDER — FENTANYL CITRATE 0.05 MG/ML IJ SOLN: 25.0000 ug | INTRAMUSCULAR | Status: DC | PRN

## 2012-11-28 MED ORDER — METOPROLOL TARTRATE 1 MG/ML IV SOLN
INTRAVENOUS | Status: AC
  Filled 2012-11-28: qty 5

## 2012-11-28 MED ORDER — CYCLOSPORINE 0.05 % OP EMUL
2.0000 [drp] | Freq: Two times a day (BID) | OPHTHALMIC | Status: DC
  Administered 2012-11-28 – 2012-12-05 (×14): 2 [drp] via OPHTHALMIC
  Filled 2012-11-28 (×18): qty 1

## 2012-11-28 MED ORDER — LACTATED RINGERS IV SOLN
INTRAVENOUS | Status: DC
  Administered 2012-11-28: 12:00:00 via INTRAVENOUS

## 2012-11-28 MED ORDER — FENTANYL CITRATE 0.05 MG/ML IJ SOLN
25.0000 ug | INTRAMUSCULAR | Status: AC
  Administered 2012-11-28 (×2): 25 ug via INTRAVENOUS

## 2012-11-28 MED ORDER — BUPIVACAINE IN DEXTROSE 0.75-8.25 % IT SOLN
INTRATHECAL | Status: DC | PRN
  Administered 2012-11-28: 15 mg via INTRATHECAL

## 2012-11-28 MED ORDER — PHENOL 1.4 % MT LIQD
1.0000 | OROMUCOSAL | Status: DC | PRN
  Filled 2012-11-28: qty 177

## 2012-11-28 MED ORDER — PROPOFOL 10 MG/ML IV EMUL
INTRAVENOUS | Status: AC
  Filled 2012-11-28: qty 20

## 2012-11-28 MED ORDER — POLYVINYL ALCOHOL 1.4 % OP SOLN
OPHTHALMIC | Status: AC
  Filled 2012-11-28: qty 15

## 2012-11-28 MED ORDER — 0.9 % SODIUM CHLORIDE (POUR BTL) OPTIME
TOPICAL | Status: DC | PRN
  Administered 2012-11-28: 1000 mL

## 2012-11-28 MED ORDER — BUPIVACAINE-EPINEPHRINE PF 0.5-1:200000 % IJ SOLN
INTRAMUSCULAR | Status: DC | PRN
  Administered 2012-11-28: 60 mL

## 2012-11-28 MED ORDER — SODIUM CHLORIDE 0.9 % IV SOLN
INTRAVENOUS | Status: DC | PRN
  Administered 2012-11-28: 14:00:00 via INTRAVENOUS

## 2012-11-28 MED ORDER — MORPHINE SULFATE 2 MG/ML IJ SOLN
0.5000 mg | INTRAMUSCULAR | Status: DC | PRN
  Administered 2012-11-29 – 2012-12-02 (×6): 0.5 mg via INTRAVENOUS
  Filled 2012-11-28 (×6): qty 1

## 2012-11-28 MED ORDER — ACETAMINOPHEN 325 MG PO TABS: 650.0000 mg | ORAL_TABLET | Freq: Four times a day (QID) | ORAL | Status: DC | PRN

## 2012-11-28 MED ORDER — ONDANSETRON HCL 4 MG/2ML IJ SOLN: 4.0000 mg | Freq: Once | INTRAMUSCULAR | Status: DC | PRN

## 2012-11-28 MED ORDER — MIDAZOLAM HCL 2 MG/2ML IJ SOLN
INTRAMUSCULAR | Status: AC
  Filled 2012-11-28: qty 2

## 2012-11-28 SURGICAL SUPPLY — 54 items
BAG HAMPER (MISCELLANEOUS) ×2 IMPLANT
BANDAGE GAUZE ELAST BULKY 4 IN (GAUZE/BANDAGES/DRESSINGS) ×1 IMPLANT
BIT DRILL AO GAMMA 4.2X300 (BIT) ×2 IMPLANT
BLADE SURG SZ10 CARB STEEL (BLADE) ×4 IMPLANT
CHLORAPREP W/TINT 26ML (MISCELLANEOUS) ×2 IMPLANT
CLOTH BEACON ORANGE TIMEOUT ST (SAFETY) ×2 IMPLANT
COVER LIGHT HANDLE STERIS (MISCELLANEOUS) ×4 IMPLANT
COVER MAYO STAND XLG (DRAPE) ×2 IMPLANT
DECANTER SPIKE VIAL GLASS SM (MISCELLANEOUS) ×2 IMPLANT
DRAPE STERI IOBAN 125X83 (DRAPES) ×2 IMPLANT
DRSG MEPILEX BORDER 4X12 (GAUZE/BANDAGES/DRESSINGS) ×2 IMPLANT
ELECT REM PT RETURN 9FT ADLT (ELECTROSURGICAL) ×2
ELECTRODE REM PT RTRN 9FT ADLT (ELECTROSURGICAL) ×1 IMPLANT
GLOVE BIOGEL PI IND STRL 6.5 (GLOVE) IMPLANT
GLOVE BIOGEL PI IND STRL 7.5 (GLOVE) IMPLANT
GLOVE BIOGEL PI INDICATOR 6.5 (GLOVE) ×1
GLOVE BIOGEL PI INDICATOR 7.5 (GLOVE) ×1
GLOVE ECLIPSE 7.0 STRL STRAW (GLOVE) ×1 IMPLANT
GLOVE OPTIFIT SS 6.5 STRL BRWN (GLOVE) ×1 IMPLANT
GLOVE SKINSENSE NS SZ8.0 LF (GLOVE) ×1
GLOVE SKINSENSE STRL SZ8.0 LF (GLOVE) ×1 IMPLANT
GLOVE SS N UNI LF 8.5 STRL (GLOVE) ×2 IMPLANT
GOWN STRL REIN XL XLG (GOWN DISPOSABLE) ×6 IMPLANT
GUIDEROD T2 3X1000 (ROD) ×2 IMPLANT
HEMOSTAT SURGICEL 4X8 (HEMOSTASIS) ×1 IMPLANT
INST SET MAJOR BONE (KITS) ×2 IMPLANT
K-WIRE  3.2X450M STR (WIRE) ×1
K-WIRE 3.2X450M STR (WIRE) ×1
KIT BLADEGUARD II DBL (SET/KITS/TRAYS/PACK) ×2 IMPLANT
KIT ROOM TURNOVER AP CYSTO (KITS) ×2 IMPLANT
KWIRE 3.2X450M STR (WIRE) ×1 IMPLANT
MANIFOLD NEPTUNE II (INSTRUMENTS) ×2 IMPLANT
MARKER SKIN DUAL TIP RULER LAB (MISCELLANEOUS) ×2 IMPLANT
NAIL TROCH GAMMA 11X18 (Nail) ×1 IMPLANT
NDL HYPO 21X1.5 SAFETY (NEEDLE) ×1 IMPLANT
NDL SPNL 18GX3.5 QUINCKE PK (NEEDLE) ×1 IMPLANT
NEEDLE HYPO 21X1.5 SAFETY (NEEDLE) ×2 IMPLANT
NEEDLE SPNL 18GX3.5 QUINCKE PK (NEEDLE) ×2 IMPLANT
NS IRRIG 1000ML POUR BTL (IV SOLUTION) ×2 IMPLANT
PACK BASIC III (CUSTOM PROCEDURE TRAY) ×2
PACK SRG BSC III STRL LF ECLPS (CUSTOM PROCEDURE TRAY) ×1 IMPLANT
PENCIL HANDSWITCHING (ELECTRODE) ×2 IMPLANT
SCREW LAG GAMMA 3 110MM (Screw) ×1 IMPLANT
SCREW LOCKING T2 F/T  5MMX40MM (Screw) ×1 IMPLANT
SCREW LOCKING T2 F/T 5MMX40MM (Screw) IMPLANT
SET BASIN LINEN APH (SET/KITS/TRAYS/PACK) ×2 IMPLANT
SPONGE LAP 18X18 X RAY DECT (DISPOSABLE) ×4 IMPLANT
STAPLER VISISTAT 35W (STAPLE) ×1 IMPLANT
SUT MNCRL 0 VIOLET CTX 36 (SUTURE) ×1 IMPLANT
SUT MON AB 2-0 CT1 36 (SUTURE) ×2 IMPLANT
SUT MONOCRYL 0 CTX 36 (SUTURE) ×2
SYR 30ML LL (SYRINGE) ×2 IMPLANT
SYR BULB IRRIGATION 50ML (SYRINGE) ×4 IMPLANT
YANKAUER SUCT 12FT TUBE ARGYLE (SUCTIONS) ×2 IMPLANT

## 2012-11-28 NOTE — Anesthesia Procedure Notes (Addendum)
Spinal  Patient location during procedure: OR  Spinal  Patient location during procedure: OR Start time: 11/28/2012 1:07 PM Staffing CRNA/Resident: Elena Cothern J Preanesthetic Checklist Completed: patient identified, site marked, surgical consent, pre-op evaluation, timeout performed, IV checked, risks and benefits discussed and monitors and equipment checked Spinal Block Patient position: right lateral decubitus Prep: Betadine Patient monitoring: cardiac monitor, heart rate, continuous pulse ox and blood pressure Approach: right paramedian Location: L2-3 Injection technique: single-shot Needle Needle type: Spinocan  Needle gauge: 22 G Assessment Sensory level: T8 Additional Notes CSF slow and clear      [id number]      02/2013

## 2012-11-28 NOTE — Progress Notes (Signed)
Patient seen, independently examined and chart reviewed. I agree with exam, assessment and plan discussed with Toya Smothers, NP.  Patient now postoperative. Blood pressure is labile intraoperatively and therefore Dr. Romeo Apple has placed patient in the ICU overnight. There was a fair amount of blood loss the patient was transfused perioperatively.  Currently he has no complaints although he has short-term memory loss and his history is unreliable. He appears calm and comfortable. Speech is fluent and clear. He follows commands. Cardiovascular regular rate and rhythm. Respiratory clear to auscultation bilaterally. No wheezes, rales, rhonchi. Normal respiratory effort. Abdomen is soft.  Sister at bedside. She reports that he seems to have some difficulty swallowing for the last 24-48 hours.  Will continue to monitor overnight for hemodynamic stability, check CBC in the morning. Further management of hip fracture, postoperative per orthopedics. He does appear to be clinically dry. IV fluids will continue. Thrombocytopenia seems to be somewhat worse, possibly secondary to blood loss.  Resume Aggrenox when able.  Speech therapy evaluation in the morning  Brendia Sacks, MD [facility] [phone]

## 2012-11-28 NOTE — Brief Op Note (Signed)
11/26/2012 - 11/28/2012  2:34 PM  PATIENT:  Anthony Leon  79 y.o. male  PRE-OPERATIVE DIAGNOSIS:  fracture right hip  POST-OPERATIVE DIAGNOSIS:  fracture right hip  Operative findings two-part intertrochanteric fracture of the right hip   implant 125 gamma nail 110 mm lag screw and 40 mm locking bolt proximal acorn in dynamic mode  PROCEDURE:  Procedure(s): INTRAMEDULLARY (IM) NAIL INTERTROCHANTRIC (Right)  SURGEON:  Surgeon(s) and Role:    * Vickki Hearing, MD - Primary  PHYSICIAN ASSISTANT:   ASSISTANTS: [place]   ANESTHESIA:   spinal  EBL:  Total I/O In: 780 [I.V.:780] Out: 1000 [Urine:600; Blood:400]  BLOOD ADMINISTERED:250 CC PRBC  DRAINS: none   LOCAL MEDICATIONS USED:  OTHER Marcaine 0.5% epinephrine 60 cc 30 cc subfascial / 30 cc subcutaneous  SPECIMEN:  No Specimen  DISPOSITION OF SPECIMEN:  N/A  COUNTS:  YES  TOURNIQUET:  * No tourniquets in log *  DICTATION: .Dragon Dictation  PLAN OF CARE: Admit to inpatient   PATIENT DISPOSITION:  PACU - hemodynamically stable. In guarded condition will need ICU   Delay start of Pharmacological VTE agent (>24hrs) due to surgical blood loss or risk of bleeding: yes

## 2012-11-28 NOTE — Clinical Social Work Note (Signed)
CSW updated [facility] on pt. Awaiting clearance for surgery. CSW to continue to follow.  Derenda Fennel, [place] [phone]

## 2012-11-28 NOTE — Transfer of Care (Signed)
Immediate Anesthesia Transfer of Care Note  Patient: Anthony Leon  Procedure(s) Performed: Procedure(s): INTRAMEDULLARY (IM) NAIL INTERTROCHANTRIC (Right)  Patient Location: PACU  Anesthesia Type:Spinal  Level of Consciousness: sedated and pateint uncooperative  Airway & Oxygen Therapy: Patient Spontanous Breathing and Patient connected to face mask oxygen  Post-op Assessment: Report given to PACU RN and Post -op Vital signs reviewed and stable  Post vital signs: Reviewed and stable  Complications: No apparent anesthesia complications

## 2012-11-28 NOTE — Anesthesia Preprocedure Evaluation (Addendum)
Anesthesia Evaluation  Patient identified by MRN, date of birth, ID band Patient awake    Reviewed: Allergy & Precautions, H&P , NPO status , Patient's Chart, lab work & pertinent test results  Airway Mallampati: II TM Distance: >3 FB     Dental  (+) Teeth Intact   Pulmonary shortness of breath,  breath sounds clear to auscultation        Cardiovascular hypertension, +CHF Rhythm:Regular Rate:Normal     Neuro/Psych PSYCHIATRIC DISORDERS (hx agitation ) Anxiety CVA    GI/Hepatic   Endo/Other    Renal/GU      Musculoskeletal   Abdominal   Peds  Hematology   Anesthesia Other Findings   Reproductive/Obstetrics                           Anesthesia Physical Anesthesia Plan  ASA: III  Anesthesia Plan: Spinal   Post-op Pain Management:    Induction:   Airway Management Planned: Simple Face Mask  Additional Equipment:   Intra-op Plan:   Post-operative Plan:   Informed Consent: I have reviewed the patients History and Physical, chart, labs and discussed the procedure including the risks, benefits and alternatives for the proposed anesthesia with the patient or authorized representative who has indicated his/her understanding and acceptance.     Plan Discussed with:   Anesthesia Plan Comments:         Anesthesia Quick Evaluation

## 2012-11-28 NOTE — Consult Note (Addendum)
Chief Complaint: hip fx  HPI: Anthony Leon is a very pleasant somewhat demented moderately anxious 79 y.o. male who is also a world war 2 veteran with a past medical history that includes hypertension, CHF, anxiety, BPH, stroke in 2006, glaucoma presents to the emergency department after a mechanical fall with the chief complaint of hip pain. Information is obtained from the patient and the sister and the safety sitter at the bedside. [facility] and has 24-hour safety sitter's do to his frequent agitation and unsteady gait. According to the safety sitter he woke up in the middle of the night was confused about his surroundings attempted to walk and fell onto his buttocks. There was no head injury no loss of consciousness. He immediately began to complain of hip pain. Of note patient's baseline is mostly wheelchair-bound incontinent of bladder and bowel do to urgency he can feed himself and usually tell you what he needs and wants. There is no report of recent fever, nausea vomiting, dysuria hematuria. The patient's sister does indicate that his appetite has diminished over the last several weeks and that he has lost a total of 5 pounds over the last month unintentionally. In addition he has a tendency towards loose stool but in the usual amounts. No report of melena. Sister also indicates the patient uses nebulizer treatments for intermittent coughing that he's had since his pneumonia in April. She denies any chronic shortness of breath. Patient denies any chest pain palpitation headache visual disturbances. Lab work in the emergency department significant for hemoglobin of 12.2 platelets 148 otherwise unremarkable. Chest x-ray yields no evidence of cardiopulmonary disease. Right hip x-ray shows nondisplaced intertrochanteric right femur fracture. [facility] are asked to admit.   Review of Systems: 10 point review of systems complete and all are negative except as indicated  in the history of present illness.    Past Medical History   Diagnosis  Date   .  CHF (congestive heart failure)     .  Glaucoma     .  Hypertension     .  Urinary retention     .  Agitation     .  Confusion     .  DVT (deep venous thrombosis)     .  BPH (benign prostatic hyperplasia)     .  Delirium     .  CVA (cerebral infarction)        Past Surgical History   Procedure  Laterality  Date   .  Foot fracture surgery       .  Left femur fracture repair        Social History:  reports that he has never smoked. He has never used smokeless tobacco. He reports that he does not drink alcohol or use illicit drugs. [facility] and has been since his hip fracture in 2011. He is a widow with no children. He is a retired Arboriculturist. He has one sister who is his power of attorney. He has 24-hour [facility] sitter's at [facility]. He is a World War II veteran was in [employer] from 1941 and 1944    Allergies   Allergen  Reactions   .  Ciprofloxacin     .  Penicillins     .  Tetracyclines & Related     .  Tramadol       History reviewed. No pertinent family history. he  has 4 siblings his collective health history is positive for breast cancer colon cancer and kidney failure. His mother deceased at 56 from a stroke father deceased at 24 from an MI    Prior to Admission medications    Medication  Sig  Start Date  End Date  Taking?  Authorizing Provider   acetaminophen (TYLENOL) 500 MG tablet  Take 1,000 mg by mouth 2 (two) times daily.      Yes  Historical Provider, MD   albuterol (PROVENTIL) (2.5 MG/3ML) 0.083% nebulizer solution  Take 2.5 mg by nebulization every 6 (six) hours as needed for wheezing.      Yes  Historical Provider, MD   amLODipine (NORVASC) 5 MG tablet  Take 5 mg by mouth daily.      Yes  Historical Provider, MD   bimatoprost (LUMIGAN) 0.01 % SOLN  Apply 1 drop to eye at bedtime.      Yes  Historical Provider, MD   calcium-vitamin D  (OSCAL WITH D) 500-200 MG-UNIT per tablet  Take 1 tablet by mouth daily.      Yes  Historical Provider, MD   clonazePAM (KLONOPIN) 0.5 MG tablet  Take one tablet by mouth every evening for anxiety  09/04/12    Yes  Kimber Relic, MD   cycloSPORINE (RESTASIS) 0.05 % ophthalmic emulsion  Place 1 drop into both eyes 2 (two) times daily.      Yes  Historical Provider, MD   dipyridamole-aspirin (AGGRENOX) 25-200 MG per 12 hr capsule  Take 1 capsule by mouth 2 (two) times daily.      Yes  Historical Provider, MD   divalproex (DEPAKOTE) 125 MG DR tablet  Take 125 mg by mouth 3 (three) times daily.      Yes  Historical Provider, MD   fluticasone (FLONASE) 50 MCG/ACT nasal spray  Place 2 sprays into the nose daily.      Yes  Historical Provider, MD   guaiFENesin (MUCINEX) 600 MG 12 hr tablet  Take 600 mg by mouth 2 (two) times daily.      Yes  Historical Provider, MD   metoprolol tartrate (LOPRESSOR) 25 MG tablet  Take 12.5 mg by mouth 2 (two) times daily.      Yes  Historical Provider, MD   Nutritional Supplements (ENSURE PO)  Take by mouth.      Yes  Historical Provider, MD   Polyethyl Glycol-Propyl Glycol (SYSTANE OP)  Apply to eye.      Yes  Historical Provider, MD   risperiDONE (RISPERDAL) 0.25 MG tablet  Take 0.25 mg by mouth 2 (two) times daily.      Yes  Historical Provider, MD   sodium chloride (OCEAN) 0.65 % nasal spray  Place 2 sprays into the nose 2 (two) times daily.      Yes  Historical Provider, MD   Tamsulosin HCl (FLOMAX) 0.4 MG CAPS  Take 0.4 mg by mouth daily.      Yes  Historical Provider, MD   trypsin-balsam-castor oil (XENADERM) ointment  Apply topically as needed for wound care.      Yes  Historical Provider, MD   ALPRAZolam (XANAX) 0.25 MG tablet  Take 0.125 mg by mouth at bedtime as needed. For anxiety        Historical Provider, MD   furosemide (LASIX) 20 MG tablet  Take 2 tablets (40 mg total) by mouth daily.  06/22/11      Nimish Normajean Glasgow, MD   ipratropium-albuterol (DUONEB)  0.5-2.5 (3) MG/3ML SOLN  Take 3 mLs by nebulization every 6 (six) hours as needed.        Historical Provider, MD   LORazepam (ATIVAN) 0.5 MG tablet  Take one tablet by mouth twice a day as needed for anxiety/agitation.  07/25/12      Kimber Relic, MD   potassium chloride SA (K-DUR,KLOR-CON) 20 MEQ tablet  Take 2 tablets (40 mEq total) by mouth daily.  06/22/11  06/21/12    Nimish Normajean Glasgow, MD    Physical Exam: Filed Vitals:     11/26/12 0848   BP:  147/59   Pulse:  88   Temp:     Resp:  20        General:  Somewhat pale somewhat frail thin no acute distress  Eyes: PE RRL, EOMI, no scleral icterus. Small amount of greenish purulent drainage from left eye  ENT: Ears clear nose without drainage oropharynx without erythema or exudate. Mucous membranes of his mouth slightly pale very dry  Neck: Supple no JVD full range of motion  Cardiovascular: Regular rate and rhythm no murmur gallop or rub trace lower extremity edema pedal pulses present and palpable  Respiratory: Normal effort breath sounds are clear to auscultation bilaterally no wheeze no rhonchi  Abdomen: Flat soft positive bowel sounds throughout nontender to palpation no mass organomegaly noted  Skin: Slightly pale warm and dry no rash noted, left and right buttocks with erythema small breakdown just distal to the sacral area.  Musculoskeletal: Joints without swelling/erythema. Tenderness to right hip with range of motion. Right foot with slight external rotation.  Psychiatric: Calm teary-eyed slightly anxious  Neurologic: Oriented to self only. Will follow commands. Will answer questions directly however information may be unreliable due to the patient's dementia  Orthopedic examination right lower extremity his pain and tenderness over the right hip especially with range of motion a slight deformity with shortening and external rotation. His neurovascular exam of the limb is intact has good muscle tone and no joint  subluxation no muscle atrophy no tremor  Has a right intertrochanteric fracture appears to be a two-part fracture  Recommend internal fixation with gamma nail.

## 2012-11-28 NOTE — Progress Notes (Signed)
[facility] PROGRESS NOTE  Anthony Leon ZOX:[id number] DOB: [birth year] DOA: 11/26/2012 PCP: No primary provider on file.  Assessment/Plan: Fx right intertrochanteric hip: Due to mechanical fall. Surgery hopefully today. Delayed due to pt being on aggrenox prior to admission. Continue low-dose beta blocker with parameters. Pain medicine on hold for now due to over sedation yesterday. Providing tylenol. Pt denies pain unless moved.   Active Problems:   UTI: Bactrim day #2. White count within normal limits. Afebrile and more alert this am.   CHF, chronic: Last echo in 2011 with an ejection fraction of 55%. Continue to hold his Lasix for now. Will continue his low-dose beta blocker . Volume status -80ml. Appears slightly dry. continue IV fluid  75/hr. Continue intake and output and daily weight.   BPH (benign prostatic hyperplasia): currently with foley. He is typically incontinent at baseline. Continue Flomax   CVA (cerebral infarction): 2006. No deficits. Patient on Aggrenox prior to admission. Continue to hold in anticipation for surgery.   HTN (hypertension): Remains controlled. Continue his low dose beta blocker and norvasc. Will continue to hold his Lasix.   Anxiety state, unspecified: More alert this am. Has not needed prn med  Dementia with behavioral disturbance: Of note patient recently started on Risperdal. Continue to hold for now . Lethargic yesterday but more alert today. Still at risk for aspiration so holding po meds for now.   Skin breakdown: Patient mostly wheelchair-bound. Will implement appropriate skin care treatment.    Code Status: DNR Family Communication: sister at bedside Disposition Plan: back to facility when ready   Consultants:  orthopedics  Procedures:  none  Antibiotics:  Bactrim 11/27/12>>>  HPI/Subjective: Aroused to verbal stimuli. Denies pain. Follow commands  Objective: Filed Vitals:   11/28/12 0833  BP:   Pulse:   Temp: 98.8  F (37.1 C)  Resp:     Intake/Output Summary (Last 24 hours) at 11/28/12 0925 Last data filed at 11/27/12 2217  Gross per 24 hour  Intake    200 ml  Output    300 ml  Net   -100 ml   Filed Weights   11/26/12 0442 11/27/12 0433 11/28/12 0539  Weight: 83.915 kg (185 lb) 78.6 kg (173 lb 4.5 oz) 81.3 kg (179 lb 3.7 oz)    Exam:   General:  Frail somewhat pale NAD  Cardiovascular: RRR No MGR No LE edema  Respiratory: Normal effort BS coarse no wheeze no crackles  Abdomen: flat soft +BS non-tender to palpation  Musculoskeletal: right leg with slight external rotation. Very tender to touch and movement.   Data Reviewed: Basic Metabolic Panel:  Recent Labs Lab 11/26/12 0603 11/27/12 0555  NA 138 137  K 4.0 4.3  CL 104 106  CO2 27 25  GLUCOSE 97 102*  BUN 14 21  CREATININE 1.22 1.26  CALCIUM 8.8 8.5   Liver Function Tests: No results found for this basename: AST, ALT, ALKPHOS, BILITOT, PROT, ALBUMIN,  in the last 168 hours No results found for this basename: LIPASE, AMYLASE,  in the last 168 hours No results found for this basename: AMMONIA,  in the last 168 hours CBC:  Recent Labs Lab 11/26/12 0603 11/27/12 0555 11/28/12 0601  WBC 5.8 7.0 7.0  NEUTROABS 2.8  --   --   HGB 12.2* 11.5* 10.2*  HCT 37.1* 34.9* 31.4*  MCV 95.4 96.1 96.0  PLT 148* 128* 115*   Cardiac Enzymes: No results found for this basename: CKTOTAL, CKMB, CKMBINDEX, TROPONINI,  in the last 168 hours BNP (last 3 results) No results found for this basename: PROBNP,  in the last 8760 hours CBG: No results found for this basename: GLUCAP,  in the last 168 hours  Recent Results (from the past 240 hour(s))  SURGICAL PCR SCREEN     Status: None   Collection Time    11/26/12  3:30 PM      Result Value Range Status   MRSA, PCR NEGATIVE  NEGATIVE Final   Staphylococcus aureus NEGATIVE  NEGATIVE Final   Comment:            The Xpert SA Assay (FDA     approved for NASAL specimens     in  patients over 66 years of age),     is one component of     a comprehensive surveillance     program.  Test performance has     been validated by [employer] for patients greater     than or equal to 64 year old.     It is not intended     to diagnose infection nor to     guide or monitor treatment.     Studies: Dg Chest Port 1 View  11/27/2012   CLINICAL DATA:  79 year old male with shortness of breath and lethargy.  EXAM: PORTABLE CHEST - 1 VIEW  COMPARISON:  11/26/2012 and earlier.  FINDINGS: Portable AP upright view at 1217 hrs. Slightly lower lung volumes. Stable cardiac size and mediastinal contours. Visualized tracheal air column is within normal limits. No pneumothorax. No definite effusion. Opacity from extensive bilateral calcified pleural disease re- identified. No new pulmonary opacity identified.  IMPRESSION: Extensive calcified pleural disease. No new cardiopulmonary abnormality identified.   Electronically Signed   By: Augusto Gamble M.D.   On: 11/27/2012 12:40    Scheduled Meds: . amLODipine  5 mg Oral Daily  . cycloSPORINE  1 drop Both Eyes BID  . divalproex  125 mg Oral TID  . guaiFENesin  600 mg Oral BID  . influenza vac split quadrivalent PF  0.5 mL Intramuscular Tomorrow-1000  . latanoprost  1 drop Left Eye QHS  . metoprolol tartrate  12.5 mg Oral BID  . sulfamethoxazole-trimethoprim  15 mg/kg/day Intravenous Q6H  . tamsulosin  0.4 mg Oral q1800  . vancomycin  1,000 mg Intravenous 120 min pre-op   Continuous Infusions: . sodium chloride 75 mL/hr at 11/28/12 0019    Principal Problem:   Fx intertrochanteric hip Active Problems:   CHF, chronic   BPH (benign prostatic hyperplasia)   CVA (cerebral infarction)   HTN (hypertension)   Anxiety state, unspecified   Dementia with behavioral disturbance   Skin breakdown    Time spent: 30 minutes    Gwenyth Bender  [facility] Pager [phone] 7PM-7AM, please contact night-coverage at [email or web address],  password [facility] 11/28/2012, 9:25 AM  LOS: 2 days

## 2012-11-28 NOTE — OR Nursing (Signed)
Dr. Romeo Apple in to mark site

## 2012-11-28 NOTE — Anesthesia Postprocedure Evaluation (Signed)
  Anesthesia Post-op Note  Patient: Anthony Leon  Procedure(s) Performed: Procedure(s): INTRAMEDULLARY (IM) NAIL INTERTROCHANTRIC (Right)  Patient Location: PACU  Anesthesia Type:Spinal  Level of Consciousness: sedated and patient cooperative  Airway and Oxygen Therapy: Patient Spontanous Breathing and non-rebreather face mask  Post-op Pain: none  Post-op Assessment: Post-op Vital signs reviewed, Patent Airway and Pain level controlled Heart rate 121 Saturation 100% Respirations 20  Blood pressure 81/51-- 100/60  Post-op Vital Signs: Reviewed  Complications: No apparent anesthesia complications

## 2012-11-28 NOTE — Op Note (Signed)
Preop diagnosis closed 2 part intertrochanteric fracture right hip  Postop diagnosis same Procedure closed reduction internal fixation, open treatment internal fixation right hip  Surgeon Romeo Apple  Assisted by  [place]   Operative findings two-part intertrochanteric fracture  Implants 125 gamma nail with acorn in dynamic mode, sliding screw in dynamic mode, 110 mm lag screw, 40 mm locking bolt  79 year old male fell and injured his right hip, intertrochanteric fracture right hip, medical admission and workup was completed. Patient deemed suitable for surgery with appropriate risks  After site marking and chart update the patient was taken to the operating room where he received spinal anesthesia. he was placed on the fracture table with the right leg in traction and the left leg in abduction over peroneal post.   C-arm was brought in for x-rays   X-rays showed stable fracture. The lower limb was prepped and draped sterilely   Timeout was completed    a lateral incision was made over the greater trochanter extended proximally. Subcutaneous tissues were divided and the subcutaneous hemorrhage was controlled with electrocautery. Further dissection was carried out to the fascia which was split in line with the skin incision and then blunt dissection was carried out in the abductor musculature into the greater trochanter was palpated. A curved awl was passed into the greater trochanter into the femoral canal. This was confirmed by x-ray. A guidewire was placed into the cannulated awl and passed to the knee and confirmed by x-ray. Using manufacturer's recommended technique reaming was performed over the guidewire to the level of the lesser trochanter. A 125 nail was passed over the guidewire  A lateral incision was made in preparation for the lag screw. Using the cannula and perforating drill the guidewire was placed across the fracture site into the femoral head. X-rays confirmed position. The  pin was then measured. A 110 mm setting was placed on the reamer and the reamer was passed over the guidewire under C-arm guidance. The 110 lag screw was placed. The acorn was then placed proximally. The acorn was placed in a dynamic mode. The acorn was confirmed to be engaged per manufacture a technique.  Traction was then released.  We then made a second stab wound laterally past the locking bolt cannula drilled across the nail and passed a 40 mm locking bolt.  Wounds were then irrigated radiographs confirmed position of the fracture and of the implant.  Layered closure was performed proximally with 0 Monocryl and 2-0 Monocryl followed by reapproximation of skin edges with staples  The 2 distal incisions were closed with staples.  A total of 60 cc of Marcaine was injected into the wound area with 30 cc injected beneath the fascial layer and 30 cc in the subcutaneous layer.  Sterile dressing was applied  The patient was taken to recovery room in stable condition  Postop plan is for full weightbearing.  The patient will be placed in the ICU. Although there were no complications the patient had labile blood pressure is controlled with appropriate pharmacologic agents and one unit of blood

## 2012-11-28 NOTE — Addendum Note (Signed)
Addendum created 11/28/12 1545 by Franco Nones, CRNA   Modules edited: Anesthesia Medication Administration

## 2012-11-28 NOTE — Anesthesia Postprocedure Evaluation (Addendum)
  Anesthesia Post-op Note  Patient: Anthony Leon  Procedure(s) Performed: Procedure(s): INTRAMEDULLARY (IM) NAIL INTERTROCHANTRIC (Right)  Patient Location: PACU  Anesthesia Type:Spinal  Level of Consciousness: sedated  Airway and Oxygen Therapy: Patient Spontanous Breathing and Patient connected to face mask oxygen  Post-op Pain: 2 /10, mild  Post-op Assessment: Post-op Vital signs reviewed, Patient's Cardiovascular Status Stable, Respiratory Function Stable and Patent Airway  Post-op Vital Signs: Reviewed and stable  Complications: No apparent anesthesia complications 11/29/12,  Patient remains in ICU, intermittent hypotension. Patient remains on Bactrim for documented UTI.  No apparent problems associated with SAB.

## 2012-11-29 ENCOUNTER — Encounter (HOSPITAL_COMMUNITY): Payer: Self-pay | Admitting: Orthopedic Surgery

## 2012-11-29 ENCOUNTER — Inpatient Hospital Stay (HOSPITAL_COMMUNITY): Payer: Medicare PPO

## 2012-11-29 DIAGNOSIS — N390 Urinary tract infection, site not specified: Secondary | ICD-10-CM | POA: Diagnosis present

## 2012-11-29 DIAGNOSIS — R509 Fever, unspecified: Secondary | ICD-10-CM | POA: Diagnosis present

## 2012-11-29 DIAGNOSIS — E871 Hypo-osmolality and hyponatremia: Secondary | ICD-10-CM | POA: Diagnosis not present

## 2012-11-29 LAB — TYPE AND SCREEN
ABO/RH(D): O POS
Unit division: 0
Unit division: 0

## 2012-11-29 LAB — BASIC METABOLIC PANEL
BUN: 21 mg/dL (ref 6–23)
CO2: 19 mEq/L (ref 19–32)
Chloride: 99 mEq/L (ref 96–112)
Creatinine, Ser: 1.3 mg/dL (ref 0.50–1.35)
Glucose, Bld: 149 mg/dL — ABNORMAL HIGH (ref 70–99)

## 2012-11-29 LAB — CBC
HCT: 32.3 % — ABNORMAL LOW (ref 39.0–52.0)
MCH: 31.8 pg (ref 26.0–34.0)
MCHC: 34.4 g/dL (ref 30.0–36.0)
MCV: 92.6 fL (ref 78.0–100.0)
RDW: 14.1 % (ref 11.5–15.5)

## 2012-11-29 MED ORDER — VANCOMYCIN HCL 10 G IV SOLR
1250.0000 mg | INTRAVENOUS | Status: DC
  Administered 2012-11-30 – 2012-12-04 (×5): 1250 mg via INTRAVENOUS
  Filled 2012-11-29 (×7): qty 1250

## 2012-11-29 MED ORDER — DEXTROSE 5 % IV SOLN
1.0000 g | Freq: Three times a day (TID) | INTRAVENOUS | Status: DC
  Administered 2012-11-29 – 2012-12-05 (×17): 1 g via INTRAVENOUS
  Filled 2012-11-29 (×21): qty 1

## 2012-11-29 MED ORDER — SODIUM CHLORIDE 0.9 % IV SOLN
1500.0000 mg | Freq: Once | INTRAVENOUS | Status: AC
  Administered 2012-11-29: 1500 mg via INTRAVENOUS
  Filled 2012-11-29: qty 1500

## 2012-11-29 MED ORDER — METOPROLOL TARTRATE 1 MG/ML IV SOLN
2.5000 mg | Freq: Four times a day (QID) | INTRAVENOUS | Status: DC
  Administered 2012-11-29 – 2012-12-02 (×11): 2.5 mg via INTRAVENOUS
  Filled 2012-11-29 (×12): qty 5

## 2012-11-29 NOTE — Evaluation (Signed)
Clinical/Bedside Swallow Evaluation  Patient Details  Name: Anthony Leon MRN: [id number] Date of Birth: April 13, 1918  Today's Date: 11/29/2012 Time: 1102-1125 SLP Time Calculation (min): 23 min  Past Medical History:  Past Medical History  Diagnosis Date  . CHF (congestive heart failure)   . Glaucoma   . Hypertension   . Urinary retention   . Agitation   . Confusion   . DVT (deep venous thrombosis)   . BPH (benign prostatic hyperplasia)   . Delirium   . CVA (cerebral infarction)    Past Surgical History:  Past Surgical History  Procedure Laterality Date  . Foot fracture surgery    . Left femur fracture repair     HPI:  Fx right intertrochanteric hip: Due to mechanical fall. SP closed reduction internal fixation, open treatment internal fixation right hip on 11/28/12.  Pt typically resides at [facility] and has sitter. Pt has dementia, old CVA from 2006 with no residual deficits. Sitter reports that pt typically consumes a regular diet with thin liquids and self feeds. He occasionally gets choked when he drinks his liquids too fast. He has always been a mouth breather per report.   Assessment / Plan / Recommendation Clinical Impression  Anthony Leon is not sufficiently alert to safely take po at this time. Although he was intermittantly able to respond to questions and attempted to follow simple commands, his eyes were frequently closed and he was snoring (back of tongue along soft palate). Pt with ropey secretions along tongue tip that were difficult to remove as pt unable to protrude tongue. Pt attempts to chew toothette when given oral care. He was able to swallow several presentations of ice chips and a bite or two of ice cream and puree with max cues for alertness and verbal cues to close lips around spoon and to swallow. Recommend continue NPO with reassessment in the AM with hope of improved alertness. Continue oral care and ok for RN to give pt 1-2 ice chips periodically after oral  care if pt is requesting. Above to RN and Sports coach.    Aspiration Risk  Moderate    Diet Recommendation NPO SLP to reassess tomorrow for improved alertness.       Other  Recommendations     Follow Up Recommendations  Skilled Nursing facility    Frequency and Duration min 3x week  1 week       SLP Swallow Goals  Pending   Swallow Study Prior Functional Status   Prior to admission, pt consumed regular diet with thin liquids and resided at [facility]. Has personal sitter, dementia.    General Date of Onset: 11/26/12 HPI: Fx right intertrochanteric hip: Due to mechanical fall. SP closed reduction internal fixation, open treatment internal fixation right hip on 11/28/12.  Pt typically resides at [facility] and has sitter. Pt has dementia, old CVA from 2006 with no residual deficits. Sitter reports that pt typically consumes a regular diet with thin liquids and self feeds. He occasionally gets choked when he drinks his liquids too fast. He has always been a mouth breather per report. Type of Study: Bedside swallow evaluation Previous Swallow Assessment: None on record Diet Prior to this Study: NPO Temperature Spikes Noted: Yes Respiratory Status: Supplemental O2 delivered via (comment) History of Recent Intubation: No Length of Intubations (days): 0 days Behavior/Cognition: Lethargic;Decreased sustained attention;Requires cueing Oral Cavity - Dentition: Adequate natural dentition Self-Feeding Abilities: Total assist Patient Positioning: Upright in bed Baseline Vocal Quality: Breathy Volitional Cough: Weak  Volitional Swallow: Unable to elicit    Oral/Motor/Sensory Function Overall Oral Motor/Sensory Function: Impaired (Pt had difficulty with lingual protrusion, decreased cogniti) Labial Strength: Reduced Lingual ROM: Reduced right;Reduced left Lingual Symmetry: Within Functional Limits Lingual Strength: Reduced Lingual Sensation: Reduced Facial Symmetry: Within Functional  Limits Facial Strength: Reduced Velum:  (unable to assess) Mandible: Within Functional Limits   Ice Chips Ice chips: Impaired Presentation: Spoon Oral Phase Impairments: Reduced labial seal;Reduced lingual movement/coordination;Poor awareness of bolus Oral Phase Functional Implications: Prolonged oral transit Pharyngeal Phase Impairments: Suspected delayed Swallow   Thin Liquid Thin Liquid: Not tested    Nectar Thick Nectar Thick Liquid: Impaired (sherbet used) Presentation: Spoon Oral Phase Impairments: Reduced labial seal;Reduced lingual movement/coordination Oral phase functional implications: Prolonged oral transit;Oral holding Pharyngeal Phase Impairments: Suspected delayed Swallow;Throat Clearing - Delayed   Honey Thick Honey Thick Liquid: Not tested   Puree Puree: Impaired Presentation: Spoon Oral Phase Impairments: Reduced labial seal;Impaired anterior to posterior transit;Poor awareness of bolus;Reduced lingual movement/coordination Oral Phase Functional Implications: Prolonged oral transit;Oral holding;Oral residue Pharyngeal Phase Impairments: Suspected delayed Swallow   Solid       Solid: Not tested      Thank you,  Havery Moros, CCC-SLP [phone]  Jeanette Moffatt 11/29/2012,11:53 AM

## 2012-11-29 NOTE — Progress Notes (Signed)
[facility] PROGRESS NOTE  Anthony Leon WUJ:[id number] DOB: 1918/05/27 DOA: 11/26/2012 PCP: No primary provider on file.  Assessment/Plan: Fx right intertrochanteric hip: Due to mechanical fall. SP closed reduction internal fixation, open treatment internal fixation right hip on 11/28/12. Blood pressure stable, tachycardic, febrile. Likely related to UTI and blood loss. Urine culture no growth. Will continue Bactrim IV, increase fluids to 142ml/hr, resume BB IV with parameters. Hg stable. Will monitor closely with hydration  Active Problems:  UTI: Bactrim day #3. White count within normal limits. Febrile, tachycardic. Urine culture with no growth.    Hyponatremia: likely related to volume loss yesterday in surgery. Will increase IV fluids slightly. Monitor.   CHF, chronic: Last echo in 2011 with an ejection fraction of 55%. Volume status +2.6L. Appears somewhat dry. Fair amount blood loss reported in surgery yesterday.  Continue to hold his Lasix for now. Will continue his low-dose beta blocker . Will increase fluids slightly. Continue intake and output and daily weight.   BPH (benign prostatic hyperplasia): currently with foley. He is typically incontinent at baseline. Continue Flomax   CVA (cerebral infarction): 2006. No deficits. Patient on Aggrenox prior to admission. Continue to hold in anticipation for surgery. Resume when able.  HTN (hypertension): Reportedly labile during surgery yesterday. SBP range 104-136.  Continue his low dose beta blocker with parameters. Will continue to hold his Lasix.   Dysphagia: pt with difficulty swallowing pre-operatively. Will request ST for swallow eval. NPO until results.  Anxiety state, unspecified: More alert this am. Has not needed prn med  Dementia with behavioral disturbance: Of note patient recently started on Risperdal. Continue to hold for now . Lethargic yesterday but more alert today. Still at risk for aspiration so holding po meds for  now.  Skin breakdown: Patient mostly wheelchair-bound. Will implement appropriate skin care treatment.   Code Status: DNR Family Communication: sister at bedside Disposition Plan: back to facility when ready   Consultants:  orthopedics Procedures:  Closed reduction right hip fx  Antibiotics: Bactrim 11/27/12<<< HPI/Subjective: Lying in bed eyes open, somewhat uncomfortable/anxious appearing. Denies pain.   Objective: Filed Vitals:   11/29/12 0808  BP:   Pulse:   Temp: 101.2 F (38.4 C)  Resp:     Intake/Output Summary (Last 24 hours) at 11/29/12 0847 Last data filed at 11/29/12 0647  Gross per 24 hour  Intake 4578.67 ml  Output   1800 ml  Net 2778.67 ml   Filed Weights   11/28/12 0539 11/28/12 1140 11/29/12 0500  Weight: 81.3 kg (179 lb 3.7 oz) 81.194 kg (179 lb) 82.2 kg (181 lb 3.5 oz)    Exam:   General:  Slightly pale and frail appearing, anxious  Cardiovascular: tachycardic but regular, No MGR No LE edeam  Respiratory: mild increased work of breathing, BS coarse, no crackles some use of abdominal accessory muscles   Abdomen: flat soft +BS non-tender to palpation no mass foley intact draining somewhat concentrated urine but clear  Musculoskeletal:  Dressing right hip dry and intact. No clubbing no cyanosis  Data Reviewed: Basic Metabolic Panel:  Recent Labs Lab 11/26/12 0603 11/27/12 0555 11/28/12 0954 11/29/12 0602  NA 138 137 134* 129*  K 4.0 4.3 4.2 4.1  CL 104 106 102 99  CO2 27 25 25 19   GLUCOSE 97 102* 121* 149*  BUN 14 21 27* 21  CREATININE 1.22 1.26 1.34 1.30  CALCIUM 8.8 8.5 8.3* 7.9*   Liver Function Tests: No results found for this basename: AST,  ALT, ALKPHOS, BILITOT, PROT, ALBUMIN,  in the last 168 hours No results found for this basename: LIPASE, AMYLASE,  in the last 168 hours No results found for this basename: AMMONIA,  in the last 168 hours CBC:  Recent Labs Lab 11/26/12 0603 11/27/12 0555 11/24/2012 0601  11/29/12 0602  WBC 5.8 7.0 7.0 8.1  NEUTROABS 2.8  --   --   --   HGB 12.2* 11.5* 10.2* 11.1*  HCT 37.1* 34.9* 31.4* 32.3*  MCV 95.4 96.1 96.0 92.6  PLT 148* 128* 115* 121*   Cardiac Enzymes: No results found for this basename: CKTOTAL, CKMB, CKMBINDEX, TROPONINI,  in the last 168 hours BNP (last 3 results) No results found for this basename: PROBNP,  in the last 8760 hours CBG: No results found for this basename: GLUCAP,  in the last 168 hours  Recent Results (from the past 240 hour(s))  SURGICAL PCR SCREEN     Status: None   Collection Time    11/26/12  3:30 PM      Result Value Range Status   MRSA, PCR NEGATIVE  NEGATIVE Final   Staphylococcus aureus NEGATIVE  NEGATIVE Final   Comment:            The Xpert SA Assay (FDA     approved for NASAL specimens     in patients over 40 years of age),     is one component of     a comprehensive surveillance     program.  Test performance has     been validated by [employer] for patients greater     than or equal to 34 year old.     It is not intended     to diagnose infection nor to     guide or monitor treatment.  URINE CULTURE     Status: None   Collection Time    11/27/12 12:30 PM      Result Value Range Status   Specimen Description URINE, CLEAN CATCH   Final   Special Requests NONE   Final   Culture  Setup Time     Final   Value: 11/27/2012 18:51     Performed at [facility] Count     Final   Value: NO GROWTH     Performed at [facility]   Culture     Final   Value: NO GROWTH     Performed at [facility]   Report Status 12/06/2012 FINAL   Final     Studies: Dg Hip Operative Right  11/13/2012   CLINICAL DATA:  Right hip fracture. Gamma nail fixation.  EXAM: DG OPERATIVE RIGHT HIP  TECHNIQUE: A single spot fluoroscopic AP image of the right hip is submitted.  COMPARISON:  Radiographs 11/26/2012.  FINDINGS: Seven spot fluoroscopic images demonstrate gamma nail fixation of the  proximal right femur. The hardware appears well positioned. No complications are identified.  IMPRESSION: Proximal right femoral fixation without demonstrated complication.   Electronically Signed   By: Roxy Horseman   On: 05-Dec-2012 16:40   Dg Chest Port 1 View  11/27/2012   CLINICAL DATA:  79 year old male with shortness of breath and lethargy.  EXAM: PORTABLE CHEST - 1 VIEW  COMPARISON:  11/26/2012 and earlier.  FINDINGS: Portable AP upright view at 1217 hrs. Slightly lower lung volumes. Stable cardiac size and mediastinal contours. Visualized tracheal air column is within normal limits. No pneumothorax. No definite  effusion. Opacity from extensive bilateral calcified pleural disease re- identified. No new pulmonary opacity identified.  IMPRESSION: Extensive calcified pleural disease. No new cardiopulmonary abnormality identified.   Electronically Signed   By: Augusto Gamble M.D.   On: 11/27/2012 12:40    Scheduled Meds: . albuterol  2.5 mg Nebulization Q4H  . cycloSPORINE  1 drop Both Eyes BID  . cycloSPORINE  2 drop Both Eyes BID  . divalproex  125 mg Oral TID  . docusate sodium  100 mg Oral BID  . feeding supplement  237 mL Oral BID BM  . latanoprost  1 drop Left Eye QHS  . metoprolol  2.5 mg Intravenous Q6H  . polyethylene glycol  17 g Oral Daily  . polyvinyl alcohol  1 drop Both Eyes TID  . sulfamethoxazole-trimethoprim  15 mg/kg/day Intravenous Q6H  . tamsulosin  0.4 mg Oral q1800   Continuous Infusions: . sodium chloride Stopped (11/28/12 1626)  . phenylephrine (NEO-SYNEPHRINE) Adult infusion      Principal Problem:   Fx intertrochanteric hip Active Problems:   CHF, chronic   BPH (benign prostatic hyperplasia)   CVA (cerebral infarction)   HTN (hypertension)   Anxiety state, unspecified   Dementia with behavioral disturbance   Skin breakdown    Time spent: 30 minutes    [facility] M  [facility] Pager [phone]. If 7PM-7AM, please contact night-coverage at  [email or web address], password [facility] 11/29/2012, 8:47 AM  LOS: 3 days

## 2012-11-29 NOTE — Progress Notes (Signed)
Subjective: 1 Day Post-Op Procedure(s) (LRB): INTRAMEDULLARY (IM) NAIL INTERTROCHANTRIC (Right) BP 129/47  Pulse 123  Temp(Src) 100.2 F (37.9 C) (Axillary)  Resp 48  Ht 5\' 11"  (1.803 m)  Wt 181 lb 3.5 oz (82.2 kg)  BMI 25.29 kg/m2  SpO2 94%    Objective: Intake/Output from previous day: 09/17 0701 - 09/18 0700 In: 4578.7 [I.V.:1436.7; Blood:350; IV Piggyback:2792] Out: 1800 [Urine:1100; Blood:700] Intake/Output this shift:     Recent Labs  11/27/12 0555 11/28/12 0601 11/29/12 0602  HGB 11.5* 10.2* 11.1*    Recent Labs  11/28/12 0601 11/29/12 0602  WBC 7.0 8.1  RBC 3.27* 3.49*  HCT 31.4* 32.3*  PLT 115* 121*    Recent Labs  11/28/12 0954 11/29/12 0602  NA 134* 129*  K 4.2 4.1  CL 102 99  CO2 25 19  BUN 27* 21  CREATININE 1.34 1.30  GLUCOSE 121* 149*  CALCIUM 8.3* 7.9*    Recent Labs  11/26/12 1301 11/29/12 0602  INR 1.19 1.39    dressing dry no thigh swelling   Assessment/Plan: 1 Day Post-Op Procedure(s) (LRB): INTRAMEDULLARY (IM) NAIL INTERTROCHANTRIC (Right) Maintain BP Control HR  Fuller Canada 11/29/2012, 12:27 PM

## 2012-11-29 NOTE — Progress Notes (Addendum)
Addendum 1740: Hemodynamically stable. Remains tachycardic. Remains alert and responsive. Chest x-ray without acute abnormalities. Because of fever and tenuous status, antibiotics broadened and plan PICC line placement. Discussed with sister at bedside. We discussed current issues, guarded prognosis she understands that he is critically ill.  Patient seen, independently examined and chart reviewed. I agree with exam, assessment and plan discussed with Anthony Smothers, NP.  Discussed with RN--no new issues overnight, patient is febrile.  Fever up to 101.2. Minimal hypoxia. Sinus tachycardia persists.  Blood pressure was low earlier this morning, normotensive since 3 AM. Urine output adequate.    the patient is lying flat in bed, no acute distress. He appears calm. He is alert, follows simple commands. He denies pain. History is somewhat limited and unreliable secondary to chronic memory loss. Cardiovascular tachycardic, regular rhythm. No lower extremity edema. No murmur, rub or gallop. Respiratory clear to auscultation bilaterally but diminished breath sounds. No frank wheezes, rales or rhonchi. Normal respiratory effort. Abdomen soft, nontender and nondistended. Genitalia appear unremarkable. Bilateral feet appear unremarkable. Moves all extremities to command.  Laboratory studies reveal stable kidney function. Hyponatremia has somewhat worsened. Hemoglobin is stable status post 1 unit packed red blood cells. No leukocytosis. Platelet count is stable. Urinalysis was grossly positive but urine culture on revealing.  Although the patient appears clinically stable he remains febrile and intermittently hypotensive. Etiology appears unclear, could be related to fracture or suspected UTI. Continue to treat for UTI empirically despite negative culture. Because of multiple allergies for now will continue Bactrim. Check a chest x-ray, blood cultures for recurrent fever and repeat urine culture. May need to broaden  antibiotic therapy depending on clinical course.  Overall he appears about the same as yesterday, would characterize prognosis as guarded.  Anthony Sacks, MD [facility] [phone]

## 2012-11-29 NOTE — Progress Notes (Signed)
Dr. Alanda Slim said to leave foley in for another day since pt was more lethargic and he would re-assess again tomorrow.

## 2012-11-29 NOTE — Progress Notes (Addendum)
ANTIBIOTIC CONSULT NOTE - INITIAL  Pharmacy Consult for Vancomycin and Aztreonam Indication: possible UTI, suspected sepsis, and fever  Allergies  Allergen Reactions  . Ciprofloxacin Other (See Comments)    unknown  . Penicillins Other (See Comments)    unknown  . Tetracyclines & Related Other (See Comments)    unknown  . Tramadol Other (See Comments)    unknown    Patient Measurements: Height: 5\' 11"  (180.3 cm) Weight: 181 lb 3.5 oz (82.2 kg) IBW/kg (Calculated) : 75.3  Vital Signs: Temp: 100.2 F (37.9 C) (09/18 1157) Temp src: Axillary (09/18 1157) BP: 129/47 mmHg (09/18 1100) Intake/Output from previous day: 09/17 0701 - 09/18 0700 In: 4578.7 [I.V.:1436.7; Blood:350; IV Piggyback:2792] Out: 1800 [Urine:1100; Blood:700] Intake/Output from this shift:    Labs:  Recent Labs  11/27/12 0555 11/28/12 0601 11/28/12 0954 11/29/12 0602  WBC 7.0 7.0  --  8.1  HGB 11.5* 10.2*  --  11.1*  PLT 128* 115*  --  121*  CREATININE 1.26  --  1.34 1.30   Estimated Creatinine Clearance: 38.6 ml/min (by C-G formula based on Cr of 1.3). No results found for this basename: VANCOTROUGH, Leodis Binet, VANCORANDOM, GENTTROUGH, GENTPEAK, GENTRANDOM, TOBRATROUGH, TOBRAPEAK, TOBRARND, AMIKACINPEAK, AMIKACINTROU, AMIKACIN,  in the last 72 hours   Microbiology: Recent Results (from the past 720 hour(s))  SURGICAL PCR SCREEN     Status: None   Collection Time    11/26/12  3:30 PM      Result Value Range Status   MRSA, PCR NEGATIVE  NEGATIVE Final   Staphylococcus aureus NEGATIVE  NEGATIVE Final   Comment:            The Xpert SA Assay (FDA     approved for NASAL specimens     in patients over 9 years of age),     is one component of     a comprehensive surveillance     program.  Test performance has     been validated by [employer] for patients greater     than or equal to 4 year old.     It is not intended     to diagnose infection nor to     guide or monitor treatment.   URINE CULTURE     Status: None   Collection Time    11/27/12 12:30 PM      Result Value Range Status   Specimen Description URINE, CLEAN CATCH   Final   Special Requests NONE   Final   Culture  Setup Time     Final   Value: 11/27/2012 18:51     Performed at [facility] Count     Final   Value: NO GROWTH     Performed at [facility]   Culture     Final   Value: NO GROWTH     Performed at [facility]   Report Status 11/28/2012 FINAL   Final    Medical History: Past Medical History  Diagnosis Date  . CHF (congestive heart failure)   . Glaucoma   . Hypertension   . Urinary retention   . Agitation   . Confusion   . DVT (deep venous thrombosis)   . BPH (benign prostatic hyperplasia)   . Delirium   . CVA (cerebral infarction)     Medications:  Scheduled:  . albuterol  2.5 mg Nebulization Q4H  . aztreonam  1 g Intravenous Q8H  .  cycloSPORINE  1 drop Both Eyes BID  . cycloSPORINE  2 drop Both Eyes BID  . divalproex  125 mg Oral TID  . docusate sodium  100 mg Oral BID  . feeding supplement  237 mL Oral BID BM  . latanoprost  1 drop Left Eye QHS  . metoprolol  2.5 mg Intravenous Q6H  . polyethylene glycol  17 g Oral Daily  . polyvinyl alcohol  1 drop Both Eyes TID  . tamsulosin  0.4 mg Oral q1800  . [START ON 11/30/2012] vancomycin  1,250 mg Intravenous Q24H  . vancomycin  1,500 mg Intravenous Once   Assessment: 79yo male who was admitted due to fracture of right hip as a result of a fall.  S/P surgery on 9/17.  Pt being treated empirically for a UTI and despite negative cultures pt remains febrile.  MD to stop Bactrim IV and start Vancomycin and Aztreonam for suspected sepsis.  Estimated Creatinine Clearance: 38.6 ml/min (by C-G formula based on Cr of 1.3).  Goal of Therapy:  Vancomycin trough level 15-20 mcg/ml  Plan:  Vancomycin 1500mg  IV today x 1 then Vancomycin 1250mg  IV q24h Check trough at steady state Aztreonam 1gm IV  q8h Monitor labs, renal fxn, and cultures  [name] 11/29/2012,2:30 PM

## 2012-11-29 NOTE — Progress Notes (Signed)
UR chart review completed.  

## 2012-11-29 NOTE — Progress Notes (Signed)
PT Cancellation Note  Patient Details Name: [facility] MRN: [id number] DOB: 1918-03-22   Cancelled Treatment:    Reason Eval/Treat Not Completed: Patient's level of consciousness Pt is sleeping soundly and is unable to awaken enough to be able to participate in any PT.  Will try again tomorrow.  Myrlene Broker L 11/29/2012, 12:52 PM

## 2012-11-30 ENCOUNTER — Encounter (HOSPITAL_COMMUNITY): Payer: Medicare Other

## 2012-11-30 ENCOUNTER — Inpatient Hospital Stay (HOSPITAL_COMMUNITY): Payer: Medicare PPO

## 2012-11-30 DIAGNOSIS — I5033 Acute on chronic diastolic (congestive) heart failure: Secondary | ICD-10-CM

## 2012-11-30 DIAGNOSIS — I369 Nonrheumatic tricuspid valve disorder, unspecified: Secondary | ICD-10-CM

## 2012-11-30 DIAGNOSIS — I10 Essential (primary) hypertension: Secondary | ICD-10-CM

## 2012-11-30 LAB — BASIC METABOLIC PANEL
Calcium: 8.2 mg/dL — ABNORMAL LOW (ref 8.4–10.5)
GFR calc Af Amer: 56 mL/min — ABNORMAL LOW (ref >90)
GFR calc non Af Amer: 49 mL/min — ABNORMAL LOW (ref >90)
Potassium: 4.1 mEq/L (ref 3.5–5.1)
Sodium: 130 mEq/L — ABNORMAL LOW (ref 135–145)

## 2012-11-30 LAB — CBC
MCH: 32 pg (ref 26.0–34.0)
MCHC: 34.7 g/dL (ref 30.0–36.0)
Platelets: 128 10*3/uL — ABNORMAL LOW (ref 150–400)

## 2012-11-30 LAB — TSH: TSH: 2.26 u[IU]/mL (ref 0.350–4.500)

## 2012-11-30 LAB — PROTIME-INR
INR: 1.48 (ref 0.00–1.49)
Prothrombin Time: 17.5 seconds — ABNORMAL HIGH (ref 11.6–15.2)

## 2012-11-30 MED ORDER — SODIUM CHLORIDE 0.9 % IJ SOLN
10.0000 mL | Freq: Two times a day (BID) | INTRAMUSCULAR | Status: DC
  Administered 2012-12-01 – 2012-12-04 (×5): 10 mL

## 2012-11-30 MED ORDER — ALBUTEROL SULFATE (5 MG/ML) 0.5% IN NEBU
2.5000 mg | INHALATION_SOLUTION | Freq: Four times a day (QID) | RESPIRATORY_TRACT | Status: DC
  Administered 2012-11-30 – 2012-12-01 (×2): 2.5 mg via RESPIRATORY_TRACT
  Filled 2012-11-30 (×2): qty 0.5

## 2012-11-30 MED ORDER — METOPROLOL TARTRATE 1 MG/ML IV SOLN
5.0000 mg | Freq: Once | INTRAVENOUS | Status: AC
  Administered 2012-11-30: 5 mg via INTRAVENOUS

## 2012-11-30 MED ORDER — FUROSEMIDE 10 MG/ML IJ SOLN
20.0000 mg | Freq: Two times a day (BID) | INTRAMUSCULAR | Status: DC
  Administered 2012-11-30 – 2012-12-02 (×5): 20 mg via INTRAVENOUS
  Filled 2012-11-30 (×5): qty 2

## 2012-11-30 MED ORDER — SODIUM CHLORIDE 0.9 % IJ SOLN
10.0000 mL | INTRAMUSCULAR | Status: DC | PRN
  Administered 2012-11-30: 30 mL

## 2012-11-30 MED ORDER — MORPHINE SULFATE 2 MG/ML IJ SOLN
2.0000 mg | Freq: Once | INTRAMUSCULAR | Status: AC
  Administered 2012-11-30: 2 mg via INTRAVENOUS
  Filled 2012-11-30: qty 1

## 2012-11-30 NOTE — Progress Notes (Signed)
Pt to be transferred to room 339 per MD order. Report called to RN. Pt to be transferred via bed with personal belongings. Pt's sister and sitter at bedside and aware of transfer.

## 2012-11-30 NOTE — Progress Notes (Signed)
Fx right intertrochanteric hip: Due to mechanical fall. SP closed reduction internal fixation, open treatment internal fixation right hip on 11/28/12. Blood pressure somewhat labile. Remains tachycardic and febrile but somewhat lower fever. Likely related to UTI and blood loss. Urine culture no growth. Bactrim IV discontinued and azatem and vanc day #2. Hg stable. Will monitor closely with hydration . Seems no better and no worse today  Active Problems:  UTI: Vanc and azactam day #2. White count within normal limits. Febrile, tachycardic. Urine culture with no growth. See above  Hyponatremia: mild. Trending up.  likely related to volume loss in surgery. Will continue IV fluids.   CHF, chronic: Last echo in 2011 with an ejection fraction of 55%. Volume status +7.3L. Weight 86.5 from 82.2 on admission. Pro BNP 9308. Will provide gently diuresis with lasix. Discontinue IV fluid for now. Obtain 2decho.  Will continue his low-dose beta blocker with parameters . Continue intake and output and daily weight.   BPH (benign prostatic hyperplasia): currently with foley. He is typically incontinent at baseline. Continue Flomax   CVA (cerebral infarction): 2006. No deficits. Patient on Aggrenox prior to admission. Continue to hold. Resume when able.   HTN (hypertension): Remains labile SBP range 176-123 Continue his low dose beta blocker with parameters. Will continue to hold his Lasix.   Dysphagia: pt with difficulty swallowing pre-operatively. Will request ST for swallow eval. NPO until results.   Anxiety state, unspecified: More alert this am. Has not needed prn med  Dementia with behavioral disturbance: Of note patient recently started on Risperdal. Continue to hold for now . Lethargic yesterday but more alert today. Still at risk for aspiration so holding po meds for now.   Skin breakdown: Patient mostly wheelchair-bound. Will implement appropriate skin care treatment.   Physical Exam:         ;General: Slightly pale and frail appearing, anxious slightly more alert this am.   Cardiovascular: tachycardic but regular, No MGR No LE edeam  Respiratory: mild increased work of breathing, BS coarse, no crackles some use of abdominal accessory muscles  Abdomen: flat soft +BS non-tender to palpation no mass foley intact draining somewhat concentrated urine but clear  Musculoskeletal: Dressing right hip dry and intact. No clubbing no cyanosis  Time spent 30 minutes.   Toya Smothers NP  [phone]

## 2012-11-30 NOTE — Progress Notes (Signed)
Subjective: 2 Days Post-Op Procedure(s) (LRB): INTRAMEDULLARY (IM) NAIL INTERTROCHANTRIC (Right)  Objective: Vital signs in last 24 hours: Temp:  [98.9 F (37.2 C)-101.2 F (38.4 C)] 100.1 F (37.8 C) (09/19 0400) Resp:  [19-48] 22 (09/19 0100) BP: (67-142)/(37-121) 98/86 mmHg (09/19 0100) SpO2:  [94 %-98 %] 98 % (09/19 0359) Weight:  [190 lb 11.2 oz (86.5 kg)] 190 lb 11.2 oz (86.5 kg) (09/19 0500)  Intake/Output from previous day: 09/18 0701 - 09/19 0700 In: 5520.4 [I.V.:4970.4; IV Piggyback:550] Out: 1400 [Urine:1400] Intake/Output this shift:     Recent Labs  11/28/12 0601 11/29/12 0602 11/30/12 0509  HGB 10.2* 11.1* 10.8*    Recent Labs  11/29/12 0602 11/30/12 0509  WBC 8.1 7.6  RBC 3.49* 3.37*  HCT 32.3* 31.1*  PLT 121* 128*    Recent Labs  11/29/12 0602 11/30/12 0509  NA 129* 130*  K 4.1 4.1  CL 99 101  CO2 19 21  BUN 21 18  CREATININE 1.30 1.24  GLUCOSE 149* 111*  CALCIUM 7.9* 8.2*    Recent Labs  11/29/12 0602 11/30/12 0509  INR 1.39 1.48    Surgical site clean dry and intact without significant swelling  Assessment/Plan: 2 Days Post-Op Procedure(s) (LRB): INTRAMEDULLARY (IM) NAIL INTERTROCHANTRIC (Right) Address medical issues start therapy when stable  Fuller Canada 11/30/2012, 7:07 AM

## 2012-11-30 NOTE — Evaluation (Signed)
Physical Therapy Evaluation Patient Details Name: [facility] MRN: [id number] DOB: December 17, 1918 Today's Date: 11/30/2012 Time: 1308-6578 PT Time Calculation (min): 48 min  PT Assessment / Plan / Recommendation History of Present Illness  Pt is admitted with a right hip fx and had an OTIF on 11-28-12.  He is a resident of [facility] and is only minimally ambulatory with a walker there.  His primary mobility is via w/c.  He has a full time sitter due to baseline dementia.  He is cooperative today,.  Clinical Impression   Pt is seen for evaluation/tx.  He continues to be extremely drowsy but is able to awaken and is very cooperative.  We were only able to begin gentle therapeutic exercise to extremeties today due to increased HR and low BP.  He tolerated exercise well with minimal c/o pain.  We placed the bed in a semi chair position at end of tx and he tolerated this well.  He will need SNF at d/c.    PT Assessment  Patient needs continued PT services    Follow Up Recommendations  SNF    Does the patient have the potential to tolerate intense rehabilitation      Barriers to Discharge        Equipment Recommendations  None recommended by PT    Recommendations for Other Services     Frequency Min 5X/week    Precautions / Restrictions Precautions Precautions: Fall Restrictions Weight Bearing Restrictions: No RLE Weight Bearing: Weight bearing as tolerated   Pertinent Vitals/Pain       Mobility  Bed Mobility Bed Mobility: Not assessed Details for Bed Mobility Assistance: HR is in the 130s with BP tending to be low....this, combined with drowsiness impedes progression with bed mobility.    Exercises General Exercises - Lower Extremity Ankle Circles/Pumps: PROM;AAROM;Both;10 reps;Supine Short Arc Quad: PROM;AAROM;Both;10 reps;Supine Heel Slides: PROM;AAROM;Both;10 reps;Supine Hip ABduction/ADduction: PROM;AAROM;Both;10 reps;Supine   PT Diagnosis: Acute pain;Generalized  weakness;Difficulty walking  PT Problem List: Decreased strength;Decreased range of motion;Decreased activity tolerance;Decreased mobility;Cardiopulmonary status limiting activity;Pain PT Treatment Interventions: Therapeutic exercise;Functional mobility training;Therapeutic activities     PT Goals(Current goals can be found in the care plan section) Acute Rehab PT Goals Patient Stated Goal: none stated PT Goal Formulation: With family Time For Goal Achievement: 12/14/12 Potential to Achieve Goals: Fair  Visit Information  Last PT Received On: 11/30/12 History of Present Illness: Pt is admitted with a right hip fx and had an OTIF on 11-28-12.  He is a resident of [facility] and is only minimally ambulatory with a walker there.  His primary mobility is via w/c.  He has a full time sitter due to baseline dementia.  He is cooperative today,.       Prior Functioning  Home Living Family/patient expects to be discharged to:: Skilled nursing facility Prior Function Level of Independence: Needs assistance Gait / Transfers Assistance Needed: min assistance with walker for short distances, primarily w/c dependent ADL's / Homemaking Assistance Needed: max assist with all ADLs and homemaking Communication Communication: No difficulties    Cognition  Cognition Arousal/Alertness: Lethargic Behavior During Therapy: WFL for tasks assessed/performed Overall Cognitive Status: History of cognitive impairments - at baseline    Extremity/Trunk Assessment Lower Extremity Assessment Lower Extremity Assessment: Generalized weakness;RLE deficits/detail RLE Deficits / Details: Pt is very drowsy and unable to fully participate in therapeutic exercise to RLE...ROM at hip and knee is 45/45 degrees, passive   Balance Balance Balance Assessed: No  End of Session PT -  End of Session Activity Tolerance: Patient limited by fatigue;Patient limited by lethargy;Treatment limited secondary to medical complications  (Comment) (increased HR, low BP) Patient left: in bed;with call bell/phone within reach;with bed alarm set (bed in semi chair position) Nurse Communication: Mobility status  GP     Konrad Penta 11/30/2012, 11:32 AM

## 2012-11-30 NOTE — Clinical Social Work Note (Signed)
CSW continues to follow. When pt can be assessed by PT, will initiate [employer] authorization for return to SNF. PNC updated.  Derenda Fennel, [place] [phone]

## 2012-11-30 NOTE — Progress Notes (Signed)
Patient seen, examined and discussed with my nurse practitioner. Agree with above with the following changes. Given elevated BNP, patient has acute on chronic likely diastolic heart failure. Have started gentle diuresis with Lasix and agree with echocardiogram. Suspect this is the underlying cause in part of his tachycardia.  Suspected sleep apnea: Patient at times has episodes of oxygen desaturation. He is quite somnolent with snoring and is a mouth breather. Have increased oxygen from 2.5-3.5 L. Rather than subject 79 year old with dementia to aggressive CPAP, may make more sense for a Venturi facemask if needed. In the meantime, we'll transfer out of ICU to minimize ICU psychosis. Have spoken with sister and will hold off on feeding tube so that we can feed him once he becomes more awake. Speech therapy feels that his swallowing looks to be intact, he is to somnolent.

## 2012-11-30 NOTE — Progress Notes (Signed)
Pt came up to floor from ED in NAD. Will continue to monitor.

## 2012-11-30 NOTE — Progress Notes (Signed)
Speech Language Pathology Dysphagia Treatment Patient Details Name: [facility] MRN: [id number] DOB: 1918/10/22 Today's Date: 11/30/2012 Time: 4098-1191 SLP Time Calculation (min): 22 min  Assessment / Plan / Recommendation Clinical Impression  [name] reassessed today for possible initiation of PO intake. Upon observation, pt presented with rigid body movements with open mouth posture, and loud snoring resembling apnea. His breathing pattern was very inconsistent as noted by snoring, pausing, then with rigid body movements that alerted him before falling back to sleep. Given max tactile cues, SLP was able to alert pt for ~2 minutes for presentation of ice chips. Initially, thermal stimulation provided via rubbing ice chips along lower lip. Pt accepted ice chip and given tactile cueing for alertness swallowed without overt s/sx of aspiration. However, following this, pt quickly fell back to sleep. SLP spoke with family and MD regarding PO intake as it is deemed unsafe at this time due to inconsistent alertness and uncoordinated breathing pattern. SLP educated family regarding risks for aspiration and temporary alternate means of nutrition. MD spoke with family regarding options and sister stated that she would like to avoid NG tube if at all possible and would like to resolve other issues (tremors, heartrate), before reconsidering this. MD and SLP in agreeance. Family understands continued NPO and pr's risk for aspiration.     Diet Recommendation  Continue with Current Diet: NPO    SLP Plan Continue with current plan of care   Pertinent Vitals/Pain None reported.    Swallowing Goals   N/A  General Respiratory Status: Supplemental O2 delivered via (comment) Behavior/Cognition: Lethargic;Decreased sustained attention;Requires cueing;Pleasant mood Oral Cavity - Dentition: Adequate natural dentition Patient Positioning: Upright in bed  Oral Cavity - Oral Hygiene Does patient have any of  the following "at risk" factors?: Oxygen therapy - cannula, mask, simple oxygen devices Patient is HIGH RISK - Oral Care Protocol followed (see row info): Yes   Dysphagia Treatment Treatment focused on: Upgraded PO texture trials;Patient/family/caregiver education Family/Caregiver Educated: Sister and daughter. Treatment Methods/Modalities: Skilled observation Patient observed directly with PO's: Yes Type of PO's observed: Ice chips Feeding: Needs assist Type of cueing: Verbal Amount of cueing: Minimal   GO     [facility] S 11/30/2012, 10:55 AM

## 2012-11-30 NOTE — Progress Notes (Signed)
*  PRELIMINARY RESULTS* Echocardiogram 2D Echocardiogram has been performed.  Ismail Graziani 11/30/2012, 4:26 PM

## 2012-12-01 ENCOUNTER — Inpatient Hospital Stay (HOSPITAL_COMMUNITY): Payer: Medicare PPO

## 2012-12-01 LAB — URINE CULTURE

## 2012-12-01 LAB — CBC
HCT: 31.2 % — ABNORMAL LOW (ref 39.0–52.0)
MCH: 32.4 pg (ref 26.0–34.0)
MCHC: 34.3 g/dL (ref 30.0–36.0)
MCV: 94.5 fL (ref 78.0–100.0)
RDW: 13.6 % (ref 11.5–15.5)

## 2012-12-01 LAB — BASIC METABOLIC PANEL
BUN: 23 mg/dL (ref 6–23)
CO2: 24 mEq/L (ref 19–32)
Calcium: 8.2 mg/dL — ABNORMAL LOW (ref 8.4–10.5)
Glucose, Bld: 91 mg/dL (ref 70–99)
Sodium: 131 mEq/L — ABNORMAL LOW (ref 135–145)

## 2012-12-01 MED ORDER — ALBUTEROL SULFATE (5 MG/ML) 0.5% IN NEBU
2.5000 mg | INHALATION_SOLUTION | Freq: Two times a day (BID) | RESPIRATORY_TRACT | Status: DC
  Administered 2012-12-01 – 2012-12-04 (×7): 2.5 mg via RESPIRATORY_TRACT
  Filled 2012-12-01 (×8): qty 0.5

## 2012-12-01 MED ORDER — METOPROLOL TARTRATE 1 MG/ML IV SOLN: 5.0000 mg | Freq: Four times a day (QID) | INTRAVENOUS | Status: DC | PRN

## 2012-12-01 NOTE — Progress Notes (Signed)
Pt is very restless today.  I have notified SN.  We have planned for him to be seen once over the weekend and this will occur tomorrow.

## 2012-12-01 NOTE — Progress Notes (Signed)
[facility] PROGRESS NOTE  Anthony Leon ZOX:[id number] DOB: 1918/10/06 DOA: 11/26/2012 PCP: No primary provider on file.  Assessment/Plan: UTI: Vanc and azactam day #2. White count within normal limits. Patient now afebrile times almost 48 hours, remains tachycardic. Urine culture with no growth.   Hyponatremia: mild. Almost fully resolved, likely related to volume loss in surgery. IV fluids stopped secondary to CHF  Acute on chronic diastolic CHF: Last echo in 2011 with an ejection fraction of 55%. Volume status +7.3L. Weight 86.5 from 82.2 on admission. Pro BNP 9308. Will provide gently diuresis with lasix. Discontinue IV fluid for now. Obtain 2decho. Will continue his low-dose beta blocker with parameters . Continue intake and output and daily weight. Diuresis started on 9/19. Diurese 2.7 L on first day  BPH (benign prostatic hyperplasia): currently with foley. He is typically incontinent at baseline. Continue Flomax   CVA (cerebral infarction): 2006. No deficits. Patient on Aggrenox prior to admission. Continue to hold. Resume when able.   HTN (hypertension): Remains labile SBP range 176-123 Continue his low dose beta blocker with parameters. Lasix restarted.  Dysphagia: pt with difficulty swallowing pre-operatively. Speech therapy felt the patient's swallowing was intact and this may be more related to his somnolence. See below.  Anxiety state, unspecified: More alert this am. Has not needed prn med   Dementia with behavioral disturbance: Of note patient recently started on Risperdal. Continue to hold for now . Lethargic yesterday but more alert today. Still at risk for aspiration so holding po meds for now.   Skin breakdown: Patient mostly wheelchair-bound. Will implement appropriate skin care treatment.   Nutrition: Sister is a little hesitant to start NG tube. I told her I'm concerned about patient's somnolence and would like this improve before we can start oral feedings.  Patient's sister agreeable with plan to wait one more day to try to minimize his somnolence. See below.  Sleep apnea-suspected. Patient is a mouth breather. We'll try to keep awake during the day today. Tonight, when he sleeps, will use Ventimask to maximize oxygen. Given his advanced age and dementia, CPAP with tight seal would not be appropriate.   Code Status: DO NOT RESUSCITATE Family Communication: Spoke with sister at the bedside  Disposition Plan: Eventually back to skilled nursing facility   Consultants:  [place] [facility]  Procedures:  Status post PICC line placement 9/19  Echocardiogram 9/19: Unable to ascertain diastolic dysfunction (suspected) no evidence of systolic dysfunction  Antibiotics:  Bactrim x3 days-stopped on 9/18  Vancomycin and Azactam day #3  HPI/Subjective: Patient still remains somnolent, but easily able to be woken. Tolerated transferred to telemetry bed. Nursing reports patient did sleep most of the night.  Objective: Filed Vitals:   12/01/12 0749  BP:   Pulse:   Temp:   Resp: 18    Intake/Output Summary (Last 24 hours) at 12/01/12 1012 Last data filed at 12/01/12 0503  Gross per 24 hour  Intake 518.33 ml  Output   3500 ml  Net -2981.67 ml   Filed Weights   11/30/12 0500 12/01/12 0456 12/01/12 0500  Weight: 86.5 kg (190 lb 11.2 oz) 79.9 kg (176 lb 2.4 oz) 80 kg (176 lb 5.9 oz)    Exam:   General:  Somnolent, easily arousable, chronic confusion  Cardiovascular: Tachycardic, but regular rhythm  Respiratory: Clear to auscultation bilaterally  Abdomen: Soft, nontender, nondistended, positive bowel sounds, fully intact  Musculoskeletal: Right hip dressing dry and intact, no clubbing or cyanosis, trace pitting edema  Data Reviewed: Basic Metabolic Panel:  Recent Labs Lab 11/27/12 0555 11/28/12 0954 11/29/12 0602 11/30/12 0509 12/01/12 0510  NA 137 134* 129* 130* 131*  K 4.3 4.2 4.1 4.1 4.0  CL 106 102 99 101 99   CO2 25 25 19 21 24   GLUCOSE 102* 121* 149* 111* 91  BUN 21 27* 21 18 23   CREATININE 1.26 1.34 1.30 1.24 1.30  CALCIUM 8.5 8.3* 7.9* 8.2* 8.2*   CBC:  Recent Labs Lab 11/26/12 0603 11/27/12 0555 11/28/12 0601 11/29/12 0602 11/30/12 0509 12/01/12 0510  WBC 5.8 7.0 7.0 8.1 7.6 7.5  NEUTROABS 2.8  --   --   --   --   --   HGB 12.2* 11.5* 10.2* 11.1* 10.8* 10.7*  HCT 37.1* 34.9* 31.4* 32.3* 31.1* 31.2*  MCV 95.4 96.1 96.0 92.6 92.3 94.5  PLT 148* 128* 115* 121* 128* 154   Cardiac Enzymes:  Recent Labs Lab 11/30/12 0759  TROPONINI <0.30   BNP (last 3 results)  Recent Labs  11/30/12 0759  PROBNP 9308.0*   CBG: No results found for this basename: GLUCAP,  in the last 168 hours  Recent Results (from the past 240 hour(s))  SURGICAL PCR SCREEN     Status: None   Collection Time    11/26/12  3:30 PM      Result Value Range Status   MRSA, PCR NEGATIVE  NEGATIVE Final   Staphylococcus aureus NEGATIVE  NEGATIVE Final   Comment:            The Xpert SA Assay (FDA     approved for NASAL specimens     in patients over 32 years of age),     is one component of     a comprehensive surveillance     program.  Test performance has     been validated by [employer] for patients greater     than or equal to 91 year old.     It is not intended     to diagnose infection nor to     guide or monitor treatment.  URINE CULTURE     Status: None   Collection Time    11/27/12 12:30 PM      Result Value Range Status   Specimen Description URINE, CLEAN CATCH   Final   Special Requests NONE   Final   Culture  Setup Time     Final   Value: 11/27/2012 18:51     Performed at [facility] Count     Final   Value: NO GROWTH     Performed at [facility]   Culture     Final   Value: NO GROWTH     Performed at [facility]   Report Status 11/28/2012 FINAL   Final  URINE CULTURE     Status: None   Collection Time    11/29/12  3:00 PM       Result Value Range Status   Specimen Description URINE, CATHETERIZED   Final   Special Requests NONE   Final   Culture  Setup Time     Final   Value: 11/29/2012 19:00     Performed at [facility] Count     Final   Value: NO GROWTH     Performed at [facility]   Culture     Final   Value: NO GROWTH  Performed at [facility]   Report Status 12/01/2012 FINAL   Final     Studies: Dg Chest Port 1 View  11/30/2012   IMPRESSION: Tip of right PICC projects over the superior cavoatrial junction. The line could be withdrawn 2-3 cm for better positioning.   Electronically Signed   By: Drusilla Kanner M.D.   On: 11/30/2012 14:02    Scheduled Meds: . albuterol  2.5 mg Nebulization Q6H  . aztreonam  1 g Intravenous Q8H  . cycloSPORINE  2 drop Both Eyes BID  . divalproex  125 mg Oral TID  . docusate sodium  100 mg Oral BID  . feeding supplement  237 mL Oral BID BM  . furosemide  20 mg Intravenous Q12H  . latanoprost  1 drop Left Eye QHS  . metoprolol  2.5 mg Intravenous Q6H  . polyethylene glycol  17 g Oral Daily  . polyvinyl alcohol  1 drop Both Eyes TID  . sodium chloride  10-40 mL Intracatheter Q12H  . tamsulosin  0.4 mg Oral q1800  . vancomycin  1,250 mg Intravenous Q24H   Continuous Infusions:   Principal Problem:   Fx intertrochanteric hip Active Problems:   Acute on chronic diastolic heart failure   BPH (benign prostatic hyperplasia)   CVA (cerebral infarction)   HTN (hypertension)   Anxiety state, unspecified   Dementia with behavioral disturbance   Skin breakdown   Hyponatremia   UTI (urinary tract infection)   Fever, unspecified    Time spent: 25 min    Hollice Espy  [facility] Pager [phone]. If 7PM-7AM, please contact night-coverage at [email or web address], password [facility] 12/01/2012, 10:12 AM  LOS: 5 days

## 2012-12-02 ENCOUNTER — Inpatient Hospital Stay (HOSPITAL_COMMUNITY): Payer: Medicare PPO

## 2012-12-02 DIAGNOSIS — G9340 Encephalopathy, unspecified: Secondary | ICD-10-CM

## 2012-12-02 LAB — BLOOD GAS, ARTERIAL
O2 Content: 3 L/min
pCO2 arterial: 41.3 mmHg (ref 35.0–45.0)
pH, Arterial: 7.378 (ref 7.350–7.450)
pO2, Arterial: 80.9 mmHg (ref 80.0–100.0)

## 2012-12-02 LAB — BASIC METABOLIC PANEL
BUN: 30 mg/dL — ABNORMAL HIGH (ref 6–23)
Chloride: 96 mEq/L (ref 96–112)
GFR calc Af Amer: 55 mL/min — ABNORMAL LOW (ref >90)
GFR calc non Af Amer: 48 mL/min — ABNORMAL LOW (ref >90)
Glucose, Bld: 88 mg/dL (ref 70–99)
Potassium: 3.6 mEq/L (ref 3.5–5.1)
Sodium: 131 mEq/L — ABNORMAL LOW (ref 135–145)

## 2012-12-02 LAB — MAGNESIUM: Magnesium: 2.1 mg/dL (ref 1.5–2.5)

## 2012-12-02 LAB — CBC
HCT: 33.2 % — ABNORMAL LOW (ref 39.0–52.0)
Hemoglobin: 10.9 g/dL — ABNORMAL LOW (ref 13.0–17.0)
MCHC: 32.8 g/dL (ref 30.0–36.0)
MCV: 95.7 fL (ref 78.0–100.0)
RBC: 3.47 MIL/uL — ABNORMAL LOW (ref 4.22–5.81)

## 2012-12-02 LAB — PROTIME-INR: INR: 1.2 (ref 0.00–1.49)

## 2012-12-02 MED ORDER — METOPROLOL TARTRATE 1 MG/ML IV SOLN
5.0000 mg | Freq: Four times a day (QID) | INTRAVENOUS | Status: DC
  Administered 2012-12-02 – 2012-12-03 (×6): 5 mg via INTRAVENOUS
  Filled 2012-12-02 (×6): qty 5

## 2012-12-02 MED ORDER — SODIUM CHLORIDE 0.9 % IV SOLN
INTRAVENOUS | Status: DC
  Administered 2012-12-02 – 2012-12-03 (×3): via INTRAVENOUS

## 2012-12-02 NOTE — Progress Notes (Signed)
Patient placed on CPAP of 10 cmH2o, with 2 LPM Lovilia bled in. Patient resting comfortably and will continue to monitor.

## 2012-12-02 NOTE — Progress Notes (Addendum)
[facility] PROGRESS NOTE  RILYN UPSHAW ZOX:[id number] DOB: 1918/10/02 DOA: 11/26/2012 PCP: No primary provider on file.  Assessment/Plan: 1. Fever, suspected sepsis, UTI: No afebrile more than 48 hours. Hemodynamically stable. Tachycardia has improved. Urine cultures no growth x2 however UTI still suspected. Continue empiric antibiotics. 2. Acute encephalopathy: Suspect delirium superimposed on chronic dementia with behavioral disturbance. Likely multifactorial including hip fracture, surgery, ICU stay, UTI with suspected sepsis. Appears to wax and wane. No morphine yesterday and patient was more alert. Received morphine this morning. See below. 3. Right hip fracture status post mechanical fall: Status post surgery 9/17. 4. Hypoxia: Etiology unclear. Repeat chest x-rays have been unremarkable. I suspect sleep apnea. His clinical exam also suggests the same. Make a trial of CPAP this evening. 5. Hyponatremia: Stable. 6. Possible acute diastolic heart failure: Appears clinically compensated. 7. BPH: Continue Flomax 8. History of stroke 2006: Resume Aggrenox when able  9. Nutrition: Little oral intake since admission. See below. 10. Dysphagia: Recommendations per speech therapy 11. Dementia with behavioral disturbance: Consider resuming respiratory when improved clinically.  Long discussion with sister at bedside. Patient had declined at [facility] over the last year and especially the last few months with decreased oral intake, weight loss and decreased interest in usual activities. He has been going "down" for while now. He has severe dementia with agitation at baseline but usually does not sleep very much. We discussed current issues, treatment and investigations. The etiology of his delirium remains unclear although he was more awake yesterday when he did not receive narcotics. My suspicion this multifactorial etiology for his delirium including narcotics in addition to what is outlined  above. I doubt stroke or respiratory failure, but will check CT head and ABG to rule this out. Nutrition is becoming an issue, given that he had a very good day yesterday will observe in the 24 hours, however is not more awake tomorrow consideration needs to be given for temporary tube feeds versus covered measures. His sister has a very realistic understanding of his current status and his guarded prognosis. We discussed the possibility he might not survive this hospitalization.   Discontinue morphine  Trial CPAP tonight   Check CT head, ABG  Followup basic metabolic panel, magnesium, phosphorus  Repeat speech evaluation 9/22 with more awake  Pending studies:     Code Status: DNR DVT prophylaxis: SCDs, consider heparin if CT head negative Family Communication: As above Disposition Plan: Pending  Brendia Sacks, MD  [facility]  Pager [phone] If 7PM-7AM, please contact night-coverage at [email or web address], password [facility] 12/02/2012, 9:25 AM  LOS: 6 days   Summary: 79 year old man with hip fracture, surgery delayed secondary to Aggrenox. Preoperatively he developed confusion and started on antibiotics for suspected UTI. Underwent surgery 9/17 complicated by blood loss and postoperative hypotension. Received transfusion one unit act red blood cells with stabilization of hemoglobin. Hospitalization has been complicated by dementia with behavioral disturbance, delirium, signs of sepsis.  Consultants:  Orthopedics  Physical therapy: Skilled nursing facility  Speech therapy: N.p.o.  Procedures:  2-D echocardiogram: Left ventricular ejection fraction 55%. Mild to moderate LVH. LV diastolic function could not be assessed.  Left hip fracture repair 9/17  Status post PICC line placement 9/19   Antibiotics:  Aztreonam 9/18 >>   Vancomycin 9/18 >>   Bactrim x3 days-stopped on 9/18  HPI/Subjective: Patient can offer no history. Sister at bedside reports that patient was  much more alert and awake yesterday. Today he has been difficult to  arouse. Sister notes that patient is in suspected to have sleep apnea but not trial of CPAP was made because it was not felt the patient would tolerated.  Objective: Filed Vitals:   12/02/12 0327 12/02/12 0500 12/02/12 0631 12/02/12 0652  BP:   120/57   Pulse:      Temp:   98.7 F (37.1 C)   TempSrc:      Resp: 15  14   Height:      Weight:  78.5 kg (173 lb 1 oz)    SpO2: 95%  100% 100%    Intake/Output Summary (Last 24 hours) at 12/02/12 0925 Last data filed at 12/02/12 8657  Gross per 24 hour  Intake      0 ml  Output   1000 ml  Net  -1000 ml     Filed Weights   12/01/12 0456 12/01/12 0500 12/02/12 0500  Weight: 79.9 kg (176 lb 2.4 oz) 80 kg (176 lb 5.9 oz) 78.5 kg (173 lb 1 oz)    Exam:   Afebrile, vital signs stable. Tachycardia to have resolved.  General: Appears calm and comfortable. Sleeping. Difficult to arouse, follows some commands, squeezes both hands to command. Falls back immediately asleep and does not participate exam further.  Telemetry: Sinus rhythm.  Cardiovascular: Regular rate and rhythm. No murmur, rub, gallop.  Respiratory: Clear to auscultation bilaterally. No wheezes, rales, rhonchi. Watching the patient he is a mouth breather. He has periods of what appears to be apneic spells consistent with OSA. Respiratory effort is normal.  Abdomen: Soft, nontender, nondistended.  Lower extremities appear unremarkable.  Cannot assess psychiatric or neurologic exam  Data Reviewed:  INR noted.   CBC stable, thrombocytopenia has resolved  Scheduled Meds: . albuterol  2.5 mg Nebulization BID  . aztreonam  1 g Intravenous Q8H  . cycloSPORINE  2 drop Both Eyes BID  . divalproex  125 mg Oral TID  . docusate sodium  100 mg Oral BID  . feeding supplement  237 mL Oral BID BM  . furosemide  20 mg Intravenous Q12H  . latanoprost  1 drop Left Eye QHS  . metoprolol  2.5 mg Intravenous Q6H   . polyethylene glycol  17 g Oral Daily  . polyvinyl alcohol  1 drop Both Eyes TID  . sodium chloride  10-40 mL Intracatheter Q12H  . tamsulosin  0.4 mg Oral q1800  . vancomycin  1,250 mg Intravenous Q24H   Continuous Infusions:   Principal Problem:   Acute encephalopathy Active Problems:   Acute on chronic diastolic heart failure   BPH (benign prostatic hyperplasia)   CVA (cerebral infarction)   HTN (hypertension)   Anxiety state, unspecified   Dementia with behavioral disturbance   Fx intertrochanteric hip   Skin breakdown   Hyponatremia   UTI (urinary tract infection)   Fever, unspecified   Time spent 25 minutes

## 2012-12-02 NOTE — Progress Notes (Signed)
PT Cancellation Note  Patient Details Name: Anthony Leon MRN: [id number] DOB: 1919-01-22   Cancelled Treatment:   Attempted therapy with pt but pt is not alert enough to participate. Pt will only open eyes for a few seconds at time. Pt is unable to follow simple commands due to drowsiness. Pt's wife and sitter are at bedside. Will attempt again tomorrow.  Seth Bake, PTA  12/02/2012, 9:56 AM

## 2012-12-03 LAB — BASIC METABOLIC PANEL
Calcium: 8.5 mg/dL (ref 8.4–10.5)
Chloride: 101 mEq/L (ref 96–112)
Creatinine, Ser: 1.03 mg/dL (ref 0.50–1.35)
GFR calc Af Amer: 71 mL/min — ABNORMAL LOW (ref >90)
Glucose, Bld: 87 mg/dL (ref 70–99)
Sodium: 135 mEq/L (ref 135–145)

## 2012-12-03 LAB — VANCOMYCIN, TROUGH: Vancomycin Tr: 14.9 ug/mL (ref 10.0–20.0)

## 2012-12-03 LAB — PROTIME-INR
INR: 1.29 (ref 0.00–1.49)
Prothrombin Time: 15.8 seconds — ABNORMAL HIGH (ref 11.6–15.2)

## 2012-12-03 MED ORDER — METOPROLOL TARTRATE 25 MG PO TABS
12.5000 mg | ORAL_TABLET | Freq: Two times a day (BID) | ORAL | Status: DC
  Administered 2012-12-03 – 2012-12-05 (×4): 12.5 mg via ORAL
  Filled 2012-12-03 (×4): qty 1

## 2012-12-03 NOTE — Progress Notes (Signed)
Physical Therapy Treatment Patient Details Name: Anthony Leon MRN: [id number] DOB: 12/01/1918 Today's Date: 12/03/2012 Time: 4098-1191 PT Time Calculation (min): 46 min  PT Assessment / Plan / Recommendation  History of Present Illness     PT Comments   Pt is more awake today, dozes only occasionally.  He is able to participate in therapeutic exercise to LEs about 40%.  No c/o pain.  His vital signs are WNL, so we transferred him bed to chair with a lift device.  He tolerated this very well and is now up in recliner.  He is now wide awake and c/o hunger.  He remained very cooperative through tx.  Follow Up Recommendations        Does the patient have the potential to tolerate intense rehabilitation     Barriers to Discharge        Equipment Recommendations       Recommendations for Other Services    Frequency     Progress towards PT Goals Progress towards PT goals: Progressing toward goals  Plan      Precautions / Restrictions Restrictions Weight Bearing Restrictions: Yes RLE Weight Bearing: Non weight bearing   Pertinent Vitals/Pain     Mobility  Bed Mobility Details for Bed Mobility Assistance: attempted rolling in the bed but pt is unable to even initiate motion    Exercises General Exercises - Lower Extremity Ankle Circles/Pumps: AROM;10 reps;Both;Supine Short Arc Quad: AAROM;PROM;Both;10 reps;Supine Heel Slides: AAROM;PROM;Both;10 reps;Supine Hip ABduction/ADduction: PROM;AAROM;Both;10 reps;Supine   PT Diagnosis:    PT Problem List:   PT Treatment Interventions:     PT Goals (current goals can now be found in the care plan section)    Visit Information  Last PT Received On: 12/03/12    Subjective Data      Cognition       Balance     End of Session PT - End of Session Activity Tolerance: Patient tolerated treatment well Patient left: in chair;with call bell/phone within reach;with chair alarm set;with nursing/sitter in room;with family/visitor  present Nurse Communication: Need for lift equipment;Mobility status   GP     Konrad Penta 12/03/2012, 10:38 AM

## 2012-12-03 NOTE — Progress Notes (Signed)
INITIAL NUTRITION ASSESSMENT  DOCUMENTATION CODES Per approved criteria  -Not Applicable   INTERVENTION: Follow diet advancement add oral nutrition supplement  NUTRITION DIAGNOSIS: Inadequate oral intake related to unable to advance diet due to somulence as evidenced by interdisciplinary team observation prolonged NPO status   Goal: Meet nutrition needs as able. Pt is DNR with guarded prognosis.  Monitor:  Po intake, labs and wt trends  Reason for Assessment: Length of Stay, Low Braden= 92  79 y.o. male  Admitting Dx: Acute encephalopathy  ASSESSMENT: Pt from [facility] with 24-h private caregivers. Fall with hip fx right intertrochanteric s/p ORIF 11/28/12. Hx of dementia. Bedside swallow study 9/18. NPO due to pt unable to stay awake. Length of stay day 7 pt has been NPO for most of this hospitalization therefore  his nutrition intake is inadequate. He is up with PT today and more awake. Pt has hx of weight loss 11#, 5% over past 17 months which is not clinically significant. His Braden Score =12 which places him high risk for skin breakdown.  Patient Active Problem List   Diagnosis Date Noted  . Acute encephalopathy 12/02/2012  . Hyponatremia 11/29/2012  . UTI (urinary tract infection) 11/29/2012  . Fever, unspecified 11/29/2012  . Fx intertrochanteric hip 11/26/2012  . Skin breakdown 11/26/2012  . Dementia with behavioral disturbance 08/21/2012  . Dementia 07/24/2012  . Mood disorder in conditions classified elsewhere 07/24/2012  . CVA (cerebral infarction) 07/06/2012  . HTN (hypertension) 07/06/2012  . Diarrhea 07/06/2012  . Anxiety state, unspecified 07/06/2012  . Loss of weight 07/06/2012  . Influenza B 06/19/2011  . Dehydration 06/16/2011  . Healthcare-associated pneumonia 06/16/2011  . Delirium due to general medical condition 06/16/2011  . Acute on chronic diastolic heart failure 06/16/2011  . BPH (benign prostatic hyperplasia) 06/16/2011  . Acute respiratory  failure 06/16/2011    Height: Ht Readings from Last 1 Encounters:  11/28/12 5\' 11"  (1.803 m)    Weight: Wt Readings from Last 1 Encounters:  12/03/12 174 lb 2.6 oz (79 kg)    Ideal Body Weight: 172# (78 kg)   % Ideal Body Weight: 101%  Wt Readings from Last 10 Encounters:  12/03/12 174 lb 2.6 oz (79 kg)  12/03/12 174 lb 2.6 oz (79 kg)  12/03/12 174 lb 2.6 oz (79 kg)  06/22/11 183 lb 6.8 oz (83.2 kg)    Usual Body Weight: 167-176#  % Usual Body Weight: 100%  BMI:  Body mass index is 24.3 kg/(m^2).normal range  Estimated Nutritional Needs: Kcal: 2000-2370 Protein: 90-100 gr Fluid: >2000 ml/day  Skin: right hip incision  Diet Order: NPO  EDUCATION NEEDS: -Education not appropriate at this time   Intake/Output Summary (Last 24 hours) at 12/03/12 1158 Last data filed at 12/03/12 1114  Gross per 24 hour  Intake 961.67 ml  Output    900 ml  Net  61.67 ml    Last BM: 12/03/12  Labs:   Recent Labs Lab 12/01/12 0510 12/02/12 0501 12/03/12 0557  NA 131* 131* 135  K 4.0 3.6 3.8  CL 99 96 101  CO2 24 26 24   BUN 23 30* 32*  CREATININE 1.30 1.26 1.03  CALCIUM 8.2* 8.4 8.5  MG  --  2.1  --   PHOS  --  3.0  --   GLUCOSE 91 88 87    CBG (last 3)  No results found for this basename: GLUCAP,  in the last 72 hours  Scheduled Meds: . albuterol  2.5 mg Nebulization  BID  . aztreonam  1 g Intravenous Q8H  . cycloSPORINE  2 drop Both Eyes BID  . divalproex  125 mg Oral TID  . docusate sodium  100 mg Oral BID  . feeding supplement  237 mL Oral BID BM  . latanoprost  1 drop Left Eye QHS  . metoprolol  5 mg Intravenous Q6H  . polyethylene glycol  17 g Oral Daily  . polyvinyl alcohol  1 drop Both Eyes TID  . sodium chloride  10-40 mL Intracatheter Q12H  . tamsulosin  0.4 mg Oral q1800  . vancomycin  1,250 mg Intravenous Q24H    Continuous Infusions: . sodium chloride 50 mL/hr at 12/03/12 0981    Past Medical History  Diagnosis Date  . CHF (congestive  heart failure)   . Glaucoma   . Hypertension   . Urinary retention   . Agitation   . Confusion   . DVT (deep venous thrombosis)   . BPH (benign prostatic hyperplasia)   . Delirium   . CVA (cerebral infarction)     Past Surgical History  Procedure Laterality Date  . Foot fracture surgery    . Left femur fracture repair    . Intramedullary (im) nail intertrochanteric Right 11/28/2012    Procedure: INTRAMEDULLARY (IM) NAIL INTERTROCHANTRIC;  Surgeon: Vickki Hearing, MD;  Location: [facility];  Service: Orthopedics;  Laterality: Right;    Royann Shivers MS,RD,LDN,CSG Office: [phone] Pager: [phone]

## 2012-12-03 NOTE — Progress Notes (Signed)
ANTIBIOTIC CONSULT NOTE   Pharmacy Consult for Vancomycin and Aztreonam Indication: possible UTI, suspected sepsis, and fever  Allergies  Allergen Reactions  . Ciprofloxacin Other (See Comments)    unknown  . Penicillins Other (See Comments)    unknown  . Tetracyclines & Related Other (See Comments)    unknown  . Tramadol Other (See Comments)    unknown    Patient Measurements: Height: 5\' 11"  (180.3 cm) Weight: 174 lb 2.6 oz (79 kg) IBW/kg (Calculated) : 75.3  Vital Signs: Temp: 98.2 F (36.8 C) (09/22 1459) Temp src: Oral (09/22 1459) BP: 106/69 mmHg (09/22 1459) Pulse Rate: 135 (09/22 1459) Intake/Output from previous day: 09/21 0701 - 09/22 0700 In: 655 [I.V.:555; IV Piggyback:100] Out: 900 [Urine:900] Intake/Output from this shift: Total I/O In: 306.7 [I.V.:256.7; IV Piggyback:50] Out: -   Labs:  Recent Labs  12/01/12 0510 12/02/12 0501 12/03/12 0557  WBC 7.5 6.3  --   HGB 10.7* 10.9*  --   PLT 154 169  --   CREATININE 1.30 1.26 1.03   Estimated Creatinine Clearance: 48.7 ml/min (by C-G formula based on Cr of 1.03).  Recent Labs  12/03/12 1504  VANCOTROUGH 14.9     Microbiology: Recent Results (from the past 720 hour(s))  SURGICAL PCR SCREEN     Status: None   Collection Time    11/26/12  3:30 PM      Result Value Range Status   MRSA, PCR NEGATIVE  NEGATIVE Final   Staphylococcus aureus NEGATIVE  NEGATIVE Final   Comment:            The Xpert SA Assay (FDA     approved for NASAL specimens     in patients over 60 years of age),     is one component of     a comprehensive surveillance     program.  Test performance has     been validated by [employer] for patients greater     than or equal to 33 year old.     It is not intended     to diagnose infection nor to     guide or monitor treatment.  URINE CULTURE     Status: None   Collection Time    11/27/12 12:30 PM      Result Value Range Status   Specimen Description URINE, CLEAN  CATCH   Final   Special Requests NONE   Final   Culture  Setup Time     Final   Value: 11/27/2012 18:51     Performed at [facility] Count     Final   Value: NO GROWTH     Performed at [facility]   Culture     Final   Value: NO GROWTH     Performed at [facility]   Report Status 11/28/2012 FINAL   Final  URINE CULTURE     Status: None   Collection Time    11/29/12  3:00 PM      Result Value Range Status   Specimen Description URINE, CATHETERIZED   Final   Special Requests NONE   Final   Culture  Setup Time     Final   Value: 11/29/2012 19:00     Performed at [facility] Count     Final   Value: NO GROWTH     Performed at [facility]  Final   Value: NO GROWTH     Performed at [facility]   Report Status 12/01/2012 FINAL   Final    Medical History: Past Medical History  Diagnosis Date  . CHF (congestive heart failure)   . Glaucoma   . Hypertension   . Urinary retention   . Agitation   . Confusion   . DVT (deep venous thrombosis)   . BPH (benign prostatic hyperplasia)   . Delirium   . CVA (cerebral infarction)     Medications:  Scheduled:  . albuterol  2.5 mg Nebulization BID  . aztreonam  1 g Intravenous Q8H  . cycloSPORINE  2 drop Both Eyes BID  . divalproex  125 mg Oral TID  . docusate sodium  100 mg Oral BID  . feeding supplement  237 mL Oral BID BM  . latanoprost  1 drop Left Eye QHS  . metoprolol  5 mg Intravenous Q6H  . polyethylene glycol  17 g Oral Daily  . polyvinyl alcohol  1 drop Both Eyes TID  . sodium chloride  10-40 mL Intracatheter Q12H  . tamsulosin  0.4 mg Oral q1800  . vancomycin  1,250 mg Intravenous Q24H   Assessment: 79yo male who was admitted due to fracture of right hip as a result of a fall.  S/P surgery on 9/17.  Pt being treated empirically for a UTI and despite negative cultures pt remains febrile.  MD to stop Bactrim IV and start Vancomycin and  Aztreonam for suspected sepsis.  Estimated Creatinine Clearance: 48.7 ml/min (by C-G formula based on Cr of 1.03). Vancomycin trough at goal (14.9)  Goal of Therapy:  Vancomycin trough level 15-20 mcg/ml  Plan:  Continue Vancomycin 1250mg  IV q24h Continue Aztreonam 1gm IV q8h Monitor labs, renal fxn, and cultures  Raquel James, Kristiana Jacko Bennett 12/03/2012,3:58 PM

## 2012-12-03 NOTE — Progress Notes (Signed)
Speech Language Pathology Dysphagia Treatment  Patient Details Name: Anthony Leon MRN: [id number] DOB: May 30, 1918 Today's Date: 12/03/2012 Time: 1230-1300 SLP Time Calculation (min): 30 min  Assessment / Plan / Recommendation Clinical Impression  Pt with improved ability to tolerate po due to increased alertness. Although the patient was sleeping quietly (no longer snoring with mouth open), he eventually awakened for food trials. Pt needed cues and assist to control bolus size (liquids) and when left to his own devices he takes large uncoordinated swallows which elicit immediate cough with thins. Pt with improved oral control with nectars. Recommend initiated D3/mech soft and nectar-thick liquids with 1:1 assist. SLP to follow up tomorrow for diet tolerance and determine appropriateness of MBSS.    Diet Recommendation  Initiate / Change Diet: Dysphagia 3 (mechanical soft);Nectar-thick liquid    SLP Plan Continue with current plan of care      Swallowing Goals  SLP Swallowing Goals Patient will consume recommended diet without observed clinical signs of aspiration with: Minimal cueing Patient will utilize recommended strategies during swallow to increase swallowing safety with: Minimal assistance  General Temperature Spikes Noted: No Respiratory Status: Supplemental O2 delivered via (comment) (nasal cannula) Behavior/Cognition: Alert;Requires cueing;Decreased sustained attention;Pleasant mood Oral Cavity - Dentition: Adequate natural dentition Patient Positioning: Upright in chair  Oral Cavity - Oral Hygiene Does patient have any of the following "at risk" factors?: Nutritional status - inadequate Brush patient's teeth BID with toothbrush (using toothpaste with fluoride): Yes   Dysphagia Treatment Treatment focused on: Upgraded PO texture trials;Patient/family/caregiver education;Facilitation of oral preparatory phase;Facilitation of oral phase Family/Caregiver Educated:  Journalist, newspaper Methods/Modalities: Skilled observation;Differential diagnosis Patient observed directly with PO's: Yes Type of PO's observed: Regular;Dysphagia 3 (soft);Thin liquids;Dysphagia 1 (puree);Ice chips;Nectar-thick liquids Feeding: Needs assist Liquids provided via: Cup;Straw Oral Phase Signs & Symptoms: Anterior loss/spillage Pharyngeal Phase Signs & Symptoms: Suspected delayed swallow initiation;Audible swallow;Wet vocal quality;Immediate cough Type of cueing: Verbal;Tactile Amount of cueing: Moderate   GO    Thank you,  Havery Moros, CCC-SLP [phone]  Ellard Nan 12/03/2012, 2:51 PM

## 2012-12-03 NOTE — Progress Notes (Signed)
Patient wore CPAP all night with no problem;a good seal and patient rested well;removed 0525 after patient awoke.

## 2012-12-03 NOTE — Progress Notes (Signed)
Patient ran a 6 beat run of vt.  MD notified.  Patient asymptomatic and VSS.  Will continue to monitor.

## 2012-12-03 NOTE — Progress Notes (Signed)
Patient seen, independently examined and chart reviewed. I agree with exam, assessment and plan discussed with Toya Smothers, NP.  Tolerated BiPAP very well last night. No issues overnight. I saw the patient this morning. Very alert, interactive. Speech fluent and clear. Follows commands well. Denies pain.  Remains afebrile with stable vital signs. Appears calm and comfortable. Remains awake during the entire interview. Moves all extremities to command. Cardiovascular regular rate and rhythm. Respiratory clear to auscultation bilaterally. No wheezes, rales, rhonchi. Normal respiratory effort. Abdomen soft.  ABG, CT head and chemistry panel, magnesium and phosphorus yesterday unremarkable. CBC stable. Today basic metabolic panel is unremarkable.  Overall patient remarkably improved compared to yesterday. Now alert, did well with physical therapy and now has a diet as per speech therapy. Discussed with sister at bedside. She is pleased with progress. We will continue current therapy with antibiotics for total of 7 days. Advance diet. Avoid narcotics.  Resume Aggrenox 9/23 if ok with orthopedics.  Brendia Sacks, MD [facility] [phone]

## 2012-12-03 NOTE — Progress Notes (Signed)
[facility] PROGRESS NOTE  Anthony Leon Anthony Leon:[id number] DOB: 06-11-1918 DOA: 11/26/2012 PCP: No primary provider on file.  Assessment/Plan: Fever, suspected sepsis, UTI: No afebrile more than 72 hours. Remains hemodynamically stable. Tachycardia resolved. Urine cultures no growth x2 however UTI still suspected. Continue empiric antibiotics.   Acute encephalopathy: Suspect delirium superimposed on chronic dementia with behavioral disturbance. Likely multifactorial including hip fracture, surgery, ICU stay, UTI with suspected sepsis. Much improved this am. Cooperative. Verbalizing wants and needs. Morphine discontinued. CT head without acute abnormality. Phosphorus,  magnesium within normal limits.     Right hip fracture status post mechanical fall: Status post surgery 9/17.   Hypoxia: Etiology unclear. Repeat chest x-rays have been unremarkable. Suspected sleep apnea. His clinical exam also suggests the same. Chart indicated tolerated CPAP well last night. Will continue. May need OP follow up for home use.    Hyponatremia: Resolved this am.    Possible acute diastolic heart failure: Remains clinically compensated.   BPH: Continue Flomax   History of stroke 2006: Resume Aggrenox when able   Nutrition: Improve this am. See below.   Dysphagia: ST with bedside eval today. Await recommendations. Will start diet   Dementia with behavioral: cooperative today as well as more alert. Will continue to hold home risperdal  Code Status: DNR Family Communication: sitter at bedside Disposition Plan: back to facility when ready. Hopefully tomorrow   Consultants: Orthopedics  Physical therapy: Skilled nursing facility  Speech therapy: N.p.o.   Procedures: 2-D echocardiogram: Left ventricular ejection fraction 55%. Mild to moderate LVH. LV diastolic function could not be assessed.  Left hip fracture repair 9/17  Status post PICC line placement 9/19  Antibiotics: Aztreonam 9/18 >>   Vancomycin 9/18 >>  Bactrim x3 days-stopped on 9/18   HPI/Subjective: Up in chair. Smiling. Denies pain/discomfort. NAD  Objective: Filed Vitals:   12/03/12 1114  BP:   Pulse:   Temp:   Resp: 16    Intake/Output Summary (Last 24 hours) at 12/03/12 1440 Last data filed at 12/03/12 1114  Gross per 24 hour  Intake 961.67 ml  Output    900 ml  Net  61.67 ml   Filed Weights   12/01/12 0500 12/02/12 0500 12/03/12 0419  Weight: 80 kg (176 lb 5.9 oz) 78.5 kg (173 lb 1 oz) 79 kg (174 lb 2.6 oz)    Exam:   General:  Frail calm cooperative, alert NAD  Cardiovascular: RRR No MGR No LE edema  Respiratory: normal effort BS distant but clear bilaterally no wheeze no rhonchi  Abdomen: round, soft +BS non-tender to palpation  Musculoskeletal: no clubbing no cyanosis   Data Reviewed: Basic Metabolic Panel:  Recent Labs Lab 11/29/12 0602 11/30/12 0509 12/01/12 0510 12/02/12 0501 12/03/12 0557  NA 129* 130* 131* 131* 135  K 4.1 4.1 4.0 3.6 3.8  CL 99 101 99 96 101  CO2 19 21 24 26 24   GLUCOSE 149* 111* 91 88 87  BUN 21 18 23  30* 32*  CREATININE 1.30 1.24 1.30 1.26 1.03  CALCIUM 7.9* 8.2* 8.2* 8.4 8.5  MG  --   --   --  2.1  --   PHOS  --   --   --  3.0  --    Liver Function Tests: No results found for this basename: AST, ALT, ALKPHOS, BILITOT, PROT, ALBUMIN,  in the last 168 hours No results found for this basename: LIPASE, AMYLASE,  in the last 168 hours No results found for this basename:  AMMONIA,  in the last 168 hours CBC:  Recent Labs Lab 11/28/12 0601 11/29/12 0602 11/30/12 0509 12/01/12 0510 12/02/12 0501  WBC 7.0 8.1 7.6 7.5 6.3  HGB 10.2* 11.1* 10.8* 10.7* 10.9*  HCT 31.4* 32.3* 31.1* 31.2* 33.2*  MCV 96.0 92.6 92.3 94.5 95.7  PLT 115* 121* 128* 154 169   Cardiac Enzymes:  Recent Labs Lab 11/30/12 0759  TROPONINI <0.30   BNP (last 3 results)  Recent Labs  11/30/12 0759  PROBNP 9308.0*   CBG: No results found for this basename:  GLUCAP,  in the last 168 hours  Recent Results (from the past 240 hour(s))  SURGICAL PCR SCREEN     Status: None   Collection Time    11/26/12  3:30 PM      Result Value Range Status   MRSA, PCR NEGATIVE  NEGATIVE Final   Staphylococcus aureus NEGATIVE  NEGATIVE Final   Comment:            The Xpert SA Assay (FDA     approved for NASAL specimens     in patients over 36 years of age),     is one component of     a comprehensive surveillance     program.  Test performance has     been validated by [employer] for patients greater     than or equal to 52 year old.     It is not intended     to diagnose infection nor to     guide or monitor treatment.  URINE CULTURE     Status: None   Collection Time    11/27/12 12:30 PM      Result Value Range Status   Specimen Description URINE, CLEAN CATCH   Final   Special Requests NONE   Final   Culture  Setup Time     Final   Value: 11/27/2012 18:51     Performed at [facility] Count     Final   Value: NO GROWTH     Performed at [facility]   Culture     Final   Value: NO GROWTH     Performed at [facility]   Report Status 11/28/2012 FINAL   Final  URINE CULTURE     Status: None   Collection Time    11/29/12  3:00 PM      Result Value Range Status   Specimen Description URINE, CATHETERIZED   Final   Special Requests NONE   Final   Culture  Setup Time     Final   Value: 11/29/2012 19:00     Performed at [facility] Count     Final   Value: NO GROWTH     Performed at [facility]   Culture     Final   Value: NO GROWTH     Performed at [facility]   Report Status 12/01/2012 FINAL   Final     Studies: Ct Head Wo Contrast  12/02/2012   CLINICAL DATA:  1. No acute intracranial abnormalities.  EXAM: CT HEAD WITHOUT CONTRAST  TECHNIQUE: Contiguous axial images were obtained from the base of the skull through the vertex without intravenous contrast.   COMPARISON:  09/15/2009  FINDINGS: There is mild diffuse low-attenuation within the subcortical and periventricular white matter compatible with chronic microvascular disease. The prominence of the sulci and ventricles are identified compatible  with brain atrophy. No midline shift, ventriculomegaly, mass effect, evidence of mass lesion, intracranial hemorrhage or evidence of cortically based acute infarction. Gray-white matter differentiation is within normal limits throughout the brain. There is opacification of the frontal sinus. There is also opacification of the left mastoid air cells. Partial opacification of the right mastoid air cells noted. The calvarium is intact. .  IMPRESSION: 1. No acute intracranial abnormalities. 2. Small vessel ischemic change and brain atrophy. 3. Sinus and mastoid air cell opacification   Electronically Signed   By: Signa Kell M.D.   On: 12/02/2012 11:45   Dg Abd Portable 1v  12/01/2012   CLINICAL DATA:  Abdominal discomfort  EXAM: PORTABLE ABDOMEN - 1 VIEW  COMPARISON:  September 03, 2009  FINDINGS: The bowel gas pattern is normal. No obstruction or free air is seen on this supine examination. There is postoperative change in the gallbladder fossa region as well as in both proximal femurs. There is vascular calcification in the pelvis.  IMPRESSION: Bowel gas pattern unremarkable.   Electronically Signed   By: Bretta Bang   On: 12/01/2012 16:42    Scheduled Meds: . albuterol  2.5 mg Nebulization BID  . aztreonam  1 g Intravenous Q8H  . cycloSPORINE  2 drop Both Eyes BID  . divalproex  125 mg Oral TID  . docusate sodium  100 mg Oral BID  . feeding supplement  237 mL Oral BID BM  . latanoprost  1 drop Left Eye QHS  . metoprolol  5 mg Intravenous Q6H  . polyethylene glycol  17 g Oral Daily  . polyvinyl alcohol  1 drop Both Eyes TID  . sodium chloride  10-40 mL Intracatheter Q12H  . tamsulosin  0.4 mg Oral q1800  . vancomycin  1,250 mg Intravenous Q24H    Continuous Infusions: . sodium chloride 50 mL/hr at 12/03/12 8469    Principal Problem:   Acute encephalopathy Active Problems:   Acute on chronic diastolic heart failure   BPH (benign prostatic hyperplasia)   CVA (cerebral infarction)   HTN (hypertension)   Anxiety state, unspecified   Dementia with behavioral disturbance   Fx intertrochanteric hip   Skin breakdown   Hyponatremia   UTI (urinary tract infection)   Fever, unspecified    Time spent: 30 minutes    Gwenyth Bender  [facility] Pager [phone] If 7PM-7AM, please contact night-coverage at [email or web address], password [facility] 12/03/2012, 2:40 PM  LOS: 7 days

## 2012-12-04 ENCOUNTER — Inpatient Hospital Stay (HOSPITAL_COMMUNITY): Payer: Medicare PPO

## 2012-12-04 LAB — PROTIME-INR
INR: 1.31 (ref 0.00–1.49)
Prothrombin Time: 16 seconds — ABNORMAL HIGH (ref 11.6–15.2)

## 2012-12-04 MED ORDER — ASPIRIN-DIPYRIDAMOLE ER 25-200 MG PO CP12
1.0000 | ORAL_CAPSULE | Freq: Two times a day (BID) | ORAL | Status: DC
  Administered 2012-12-04 (×2): 1 via ORAL
  Filled 2012-12-04 (×8): qty 1

## 2012-12-04 NOTE — Progress Notes (Signed)
[facility] PROGRESS NOTE  Anthony Leon ZOX:[id number] DOB: 1918-10-17 DOA: 11/26/2012 PCP: No primary provider on file.  Assessment/Plan: Fever, suspected sepsis, UTI: Resolved.  Remains hemodynamically stable. Tachycardia resolved. Urine cultures no growth x2 however UTI still suspected. Continue empiric antibiotics.   Acute encephalopathy: Suspect delirium superimposed on chronic dementia with behavioral disturbance. Likely multifactorial including hip fracture, surgery, ICU stay, UTI with suspected sepsis. Appears at baseline. Remains cooperative. Verbalizing wants and needs. Morphine discontinued. CT head without acute abnormality. Phosphorus, magnesium within normal limits.   Right hip fracture status post mechanical fall: Status post surgery 9/17. To see Dr. Romeo Apple in 4 weeks. Dr. Romeo Apple said ok to resume aggrenox. Staples to be removed. 9/29.   Hypoxia: Etiology unclear. Repeat chest x-rays have been unremarkable. Suspected sleep apnea. His clinical exam also suggests the same. Chart indicated tolerated CPAP only half night. Will continue. May need OP follow up for home use.   Hyponatremia: Resolved    Possible acute diastolic heart failure: Remains clinically compensated.   BPH: Continue Flomax   History of stroke 2006: Resume Aggrenox.   Nutrition:tolerating dysphagia 3 diet.   Dysphagia:  Dysphagia 3 diet with nectar thick liquid  Dementia with behavioral: cooperative today as well as more alert. Will continue to hold home risperdal    Code Status: DNR Family Communication: sitter at bedside Disposition Plan: back to facility likely tomorrow   Consultants:  orthopedics  Procedures: 2-D echocardiogram: Left ventricular ejection fraction 55%. Mild to moderate LVH. LV diastolic function could not be assessed.  Left hip fracture repair 9/17  Status post PICC line placement 9/19   Antibiotics: Aztreonam 9/18 >>  Vancomycin 9/18 >>  Bactrim x3  days-stopped on 9/18   HPI/Subjective: Sitting up alert, conversing with sitter who is feeding him. Tolerating diet. Denies pain/discomfort  Objective: Filed Vitals:   12/04/12 0559  BP: 141/56  Pulse: 130  Temp: 98 F (36.7 C)  Resp: 20    Intake/Output Summary (Last 24 hours) at 12/04/12 1048 Last data filed at 12/04/12 0500  Gross per 24 hour  Intake    845 ml  Output    750 ml  Net     95 ml   Filed Weights   12/01/12 0500 12/02/12 0500 12/03/12 0419  Weight: 80 kg (176 lb 5.9 oz) 78.5 kg (173 lb 1 oz) 79 kg (174 lb 2.6 oz)    Exam:   General:  Somewhat frail, calm cooperative  Cardiovascular: RRR NO MGR No LE edema PPP  Respiratory: normal effort BS clear bilaterally to ausculation. No wheeze, no rhonchi  Abdomen: soft +BS non-tender to palpation  Musculoskeletal: no clubbing no cyanosis. Staples to right hip clean and dry   Data Reviewed: Basic Metabolic Panel:  Recent Labs Lab 11/29/12 0602 11/30/12 0509 12/01/12 0510 12/02/12 0501 12/03/12 0557  NA 129* 130* 131* 131* 135  K 4.1 4.1 4.0 3.6 3.8  CL 99 101 99 96 101  CO2 19 21 24 26 24   GLUCOSE 149* 111* 91 88 87  BUN 21 18 23  30* 32*  CREATININE 1.30 1.24 1.30 1.26 1.03  CALCIUM 7.9* 8.2* 8.2* 8.4 8.5  MG  --   --   --  2.1  --   PHOS  --   --   --  3.0  --    Liver Function Tests: No results found for this basename: AST, ALT, ALKPHOS, BILITOT, PROT, ALBUMIN,  in the last 168 hours No results found for this  basename: LIPASE, AMYLASE,  in the last 168 hours No results found for this basename: AMMONIA,  in the last 168 hours CBC:  Recent Labs Lab 11/28/12 0601 11/29/12 0602 11/30/12 0509 12/01/12 0510 12/02/12 0501  WBC 7.0 8.1 7.6 7.5 6.3  HGB 10.2* 11.1* 10.8* 10.7* 10.9*  HCT 31.4* 32.3* 31.1* 31.2* 33.2*  MCV 96.0 92.6 92.3 94.5 95.7  PLT 115* 121* 128* 154 169   Cardiac Enzymes:  Recent Labs Lab 11/30/12 0759  TROPONINI <0.30   BNP (last 3 results)  Recent Labs   11/30/12 0759  PROBNP 9308.0*   CBG: No results found for this basename: GLUCAP,  in the last 168 hours  Recent Results (from the past 240 hour(s))  SURGICAL PCR SCREEN     Status: None   Collection Time    11/26/12  3:30 PM      Result Value Range Status   MRSA, PCR NEGATIVE  NEGATIVE Final   Staphylococcus aureus NEGATIVE  NEGATIVE Final   Comment:            The Xpert SA Assay (FDA     approved for NASAL specimens     in patients over 33 years of age),     is one component of     a comprehensive surveillance     program.  Test performance has     been validated by [employer] for patients greater     than or equal to 48 year old.     It is not intended     to diagnose infection nor to     guide or monitor treatment.  URINE CULTURE     Status: None   Collection Time    11/27/12 12:30 PM      Result Value Range Status   Specimen Description URINE, CLEAN CATCH   Final   Special Requests NONE   Final   Culture  Setup Time     Final   Value: 11/27/2012 18:51     Performed at [facility] Count     Final   Value: NO GROWTH     Performed at [facility]   Culture     Final   Value: NO GROWTH     Performed at [facility]   Report Status 11/28/2012 FINAL   Final  URINE CULTURE     Status: None   Collection Time    11/29/12  3:00 PM      Result Value Range Status   Specimen Description URINE, CATHETERIZED   Final   Special Requests NONE   Final   Culture  Setup Time     Final   Value: 11/29/2012 19:00     Performed at [facility] Count     Final   Value: NO GROWTH     Performed at [facility]   Culture     Final   Value: NO GROWTH     Performed at [facility]   Report Status 12/01/2012 FINAL   Final     Studies: Ct Head Wo Contrast  12/02/2012   CLINICAL DATA:  1. No acute intracranial abnormalities.  EXAM: CT HEAD WITHOUT CONTRAST  TECHNIQUE: Contiguous axial images were obtained from  the base of the skull through the vertex without intravenous contrast.  COMPARISON:  09/15/2009  FINDINGS: There is mild diffuse low-attenuation within the subcortical and periventricular white matter  compatible with chronic microvascular disease. The prominence of the sulci and ventricles are identified compatible with brain atrophy. No midline shift, ventriculomegaly, mass effect, evidence of mass lesion, intracranial hemorrhage or evidence of cortically based acute infarction. Gray-white matter differentiation is within normal limits throughout the brain. There is opacification of the frontal sinus. There is also opacification of the left mastoid air cells. Partial opacification of the right mastoid air cells noted. The calvarium is intact. .  IMPRESSION: 1. No acute intracranial abnormalities. 2. Small vessel ischemic change and brain atrophy. 3. Sinus and mastoid air cell opacification   Electronically Signed   By: Signa Kell Anthony Leon.D.   On: 12/02/2012 11:45    Scheduled Meds: . albuterol  2.5 mg Nebulization BID  . aztreonam  1 g Intravenous Q8H  . cycloSPORINE  2 drop Both Eyes BID  . divalproex  125 mg Oral TID  . docusate sodium  100 mg Oral BID  . feeding supplement  237 mL Oral BID BM  . latanoprost  1 drop Left Eye QHS  . metoprolol tartrate  12.5 mg Oral BID  . polyethylene glycol  17 g Oral Daily  . polyvinyl alcohol  1 drop Both Eyes TID  . sodium chloride  10-40 mL Intracatheter Q12H  . tamsulosin  0.4 mg Oral q1800  . vancomycin  1,250 mg Intravenous Q24H   Continuous Infusions:   Principal Problem:   Acute encephalopathy Active Problems:   Acute on chronic diastolic heart failure   BPH (benign prostatic hyperplasia)   CVA (cerebral infarction)   HTN (hypertension)   Anxiety state, unspecified   Dementia with behavioral disturbance   Fx intertrochanteric hip   Skin breakdown   Hyponatremia   UTI (urinary tract infection)   Fever, unspecified    Time spent: 30  minutes    [facility] Anthony Leon  [facility] Pager [phone]. If 7PM-7AM, please contact night-coverage at [email or web address], password [facility] 12/04/2012, 10:48 AM  LOS: 8 days

## 2012-12-04 NOTE — Clinical Social Work Note (Signed)
Planned d/c tomorrow back to [facility]. Facility has initiated English as a second language teacher and CSW Data processing manager for Debbie at [employer] informing her of precert request. Requested return call. Kerri at [facility] is aware of above and agreeable.  Derenda Fennel, [place] [phone]

## 2012-12-04 NOTE — Progress Notes (Addendum)
Patient seen, independently examined and chart reviewed. I agree with exam, assessment and plan discussed with Anthony Smothers, NP.  Summary: 79 year old man admitted with hip fracture, surgery was significantly delayed secondary to Aggrenox. Preoperatively he developed confusion and was started on antibiotics for suspected UTI. Underwent surgery 9/17 complicated by significant blood loss and postoperative hypotension. Received transfusion one unit PRBC with stabilization of hemoglobin. Hospitalization has been complicated by dementia with behavioral disturbance, delirium, signs of sepsis. Antibiotics were broadened although urine cultures were negative and there was no other evidence of infection. He remained significantly lethargic. CT of the head was unremarkable and ABG unremarkable. There was no evidence of metabolic disturbance. He gradually improved and at this point is now tolerating a diet and appears to be at baseline. Given clinical improvement he stable for discharge 9/24  Today: Did not tolerate CPAP last night.  He did well with speech therapy yesterday, today and well with physical therapy.  He appears quite well today, quite alert. Really remarkable improvement. He denies pain.  Given clinical improvement, toleration of diet anticipate discharge 9/24. Completes antibiotics today. Be sure to avoid narcotics which cause significant sedation. I suspect his hypoxia secondary to chronic lung disease. He may benefit from an outpatient sleep study if he would cooperate. Empiric CPAP likely to be of benefit.  Brendia Sacks, MD [facility] [phone]

## 2012-12-04 NOTE — Progress Notes (Signed)
Speech Language Pathology Dysphagia Treatment Patient Details Name: Anthony Leon MRN: [id number] DOB: Sep 27, 1918 Today's Date: 12/04/2012 Time: 1030-1100 SLP Time Calculation (min): 30 min  Assessment / Plan / Recommendation Clinical Impression  Pt continues to make improvement and is stable for MBSS today.     Diet Recommendation  Continue with Current Diet: Dysphagia 3 (mechanical soft);Nectar-thick liquid    SLP Plan MBS      Swallowing Goals  SLP Swallowing Goals Patient will consume recommended diet without observed clinical signs of aspiration with: Minimal cueing Swallow Study Goal #1 - Progress: Progressing toward goal Patient will utilize recommended strategies during swallow to increase swallowing safety with: Minimal assistance Swallow Study Goal #2 - Progress: Progressing toward goal  General Temperature Spikes Noted: No Respiratory Status: Supplemental O2 delivered via (comment) (nasal cannula) Behavior/Cognition: Alert;Requires cueing;Decreased sustained attention;Pleasant mood Oral Cavity - Dentition: Adequate natural dentition Patient Positioning: Upright in bed  Oral Cavity - Oral Hygiene Does patient have any of the following "at risk" factors?: Other - dysphagia;Nutritional status - inadequate;Oxygen therapy - cannula, mask, simple oxygen devices Brush patient's teeth BID with toothbrush (using toothpaste with fluoride): Yes   Dysphagia Treatment Treatment focused on: Patient/family/caregiver education;Facilitation of oral preparatory phase;Facilitation of oral phase Family/Caregiver Educated: Journalist, newspaper Methods/Modalities: Skilled observation;Differential diagnosis Patient observed directly with PO's: Yes Type of PO's observed: Regular;Thin liquids;Nectar-thick liquids Feeding: Able to feed self with adaptive devices Liquids provided via: Cup;Straw Oral Phase Signs & Symptoms: Anterior loss/spillage Pharyngeal Phase Signs & Symptoms: Suspected  delayed swallow initiation;Audible swallow;Other (comment) (uncoordinated when trying to stop swallowing (multiple swall) Type of cueing: Verbal;Tactile Amount of cueing: Minimal   GO     Adley Castello 12/04/2012, 11:47 AM

## 2012-12-04 NOTE — Progress Notes (Signed)
RN called RRT around 00:30 to inform RRT that patient would no longer wear CPAP machine. RN placed patient back on 3 lpm.

## 2012-12-04 NOTE — Progress Notes (Signed)
Patient ID: Anthony Leon, male   DOB: 1918-09-02, 79 y.o.   MRN: [id number]  HIP FRACTURE INSTRUCTIONS: TAKE STAPLES OUT POD 12 APPLY STERI STRIPS WEIGHT BEARING AS TOLERATED WITH A WALKER X 6 WEEKS  HIP PRECAUTIONS: NO  F/u for xrays in 1 month

## 2012-12-04 NOTE — Procedures (Signed)
Objective Swallowing Evaluation: Modified Barium Swallowing Study   Patient Details  Name: Anthony Leon MRN: [id number] Date of Birth: Jul 24, 1918  Today's Date: 12/04/2012 Time: 1200-1235 SLP Time Calculation (min): 35 min  Past Medical History:  Past Medical History  Diagnosis Date  . CHF (congestive heart failure)   . Glaucoma   . Hypertension   . Urinary retention   . Agitation   . Confusion   . DVT (deep venous thrombosis)   . BPH (benign prostatic hyperplasia)   . Delirium   . CVA (cerebral infarction)    Past Surgical History:  Past Surgical History  Procedure Laterality Date  . Foot fracture surgery    . Left femur fracture repair    . Intramedullary (im) nail intertrochanteric Right 11/28/2012    Procedure: INTRAMEDULLARY (IM) NAIL INTERTROCHANTRIC;  Surgeon: Vickki Hearing, MD;  Location: [facility];  Service: Orthopedics;  Laterality: Right;   HPI:  Fx right intertrochanteric hip: Due to mechanical fall. SP closed reduction internal fixation, open treatment internal fixation right hip on 11/28/12.  Pt typically resides at [facility] and has sitter. Pt has dementia, old CVA from 2006 with no residual deficits. Sitter reports that pt typically consumes a regular diet with thin liquids and self feeds. He occasionally gets choked when he drinks his liquids too fast. He has always been a mouth breather per report.     Assessment / Plan / Recommendation Clinical Impression  Dysphagia Diagnosis: Mild oral phase dysphagia;Moderate oral phase dysphagia;Mild pharyngeal phase dysphagia;Moderate pharyngeal phase dysphagia Clinical impression: Overall mild/mod oropharyngeal based dysphagia in setting of advanced age and dehabilitation (s/p hip surgery and several days in bed with poor respiration and alertness). Pt presents with significant lingual and pharyngeal weakness which results in significant pharyngeal residue (liquid residuals from tongue also pool in valleculae). Pt with  reduced tongue base retraction, decreased hyolaryngeal excursion, decreased epiglottic deflection with overall decreased pharyngeal pressure resulting in moderate residuals distributed in valleculae, lateral channels, and small amount in pyriforms post primary swallow. Pt was cued to repeat swallo which helped to alleviate residuals but did not completely clear. Pt is at moderate risk for aspiration at this time due to amount of residuals in pharynx. Interestingly, pt only penetrated (flash) x1 with thins. Residuals with nectars were slightly greater than with thin, however pt is judged to be safer on nectars at this time due to variable alertness and demonstration of  reduced oral control at bedside. He occasionally coughs when straw is removed from his mouth. He may do better if feeder assist pinches the straw when they want him to stop drinking. Continue diet as ordered: D3/NTL with dysphagia therapy to focus on strengthening. Prognosis for upgrade to thin is good with improved strength clinically overall.    Treatment Recommendation  Therapy as outlined in treatment plan below    Diet Recommendation Dysphagia 3 (Mechanical Soft);Nectar-thick liquid   Liquid Administration via: Cup;Straw Medication Administration: Crushed with puree Supervision: Staff feed patient;Full supervision/cueing for compensatory strategies;Trained caregiver to feed patient Compensations: Slow rate;Small sips/bites;Multiple dry swallows after each bite/sip;Effortful swallow Postural Changes and/or Swallow Maneuvers: Seated upright 90 degrees;Upright 30-60 min after meal    Other  Recommendations Oral Care Recommendations: Oral care before and after PO;Staff/trained caregiver to provide oral care Other Recommendations: Order thickener from pharmacy;Clarify dietary restrictions   Follow Up Recommendations  Skilled Nursing facility    Frequency and Duration min 3x week  1 week       SLP Swallow  Goals Patient will  consume recommended diet without observed clinical signs of aspiration with: Minimal cueing Swallow Study Goal #1 - Progress: Progressing toward goal Patient will utilize recommended strategies during swallow to increase swallowing safety with: Minimal assistance Swallow Study Goal #2 - Progress: Progressing toward goal   General Date of Onset: 11/26/12 HPI: Fx right intertrochanteric hip: Due to mechanical fall. SP closed reduction internal fixation, open treatment internal fixation right hip on 11/28/12.  Pt typically resides at [facility] and has sitter. Pt has dementia, old CVA from 2006 with no residual deficits. Sitter reports that pt typically consumes a regular diet with thin liquids and self feeds. He occasionally gets choked when he drinks his liquids too fast. He has always been a mouth breather per report. Type of Study: Modified Barium Swallowing Study Reason for Referral: Objectively evaluate swallowing function Previous Swallow Assessment: BSE 12/03/2012 with rec for D3/NTL Diet Prior to this Study: Dysphagia 3 (soft);Nectar-thick liquids Temperature Spikes Noted: No Respiratory Status: Supplemental O2 delivered via (comment) (nasal cannula) History of Recent Intubation: No Length of Intubations (days): 0 days Behavior/Cognition: Requires cueing;Alert;Cooperative;Pleasant mood Oral Cavity - Dentition: Adequate natural dentition Oral Motor / Sensory Function: Impaired motor (reduced strength and coordination) Oral impairment: Right lingual;Left lingual Self-Feeding Abilities: Needs assist Patient Positioning: Upright in chair Baseline Vocal Quality: Breathy;Low vocal intensity Volitional Cough: Weak Volitional Swallow: Unable to elicit Anatomy: Within functional limits Pharyngeal Secretions: Not observed secondary MBS    Reason for Referral Objectively evaluate swallowing function   Oral Phase Oral Preparation/Oral Phase Oral Phase: Impaired Oral - Nectar Oral - Nectar Straw:  Reduced posterior propulsion;Lingual/palatal residue;Piecemeal swallowing Oral - Thin Oral - Thin Cup: Reduced posterior propulsion;Lingual/palatal residue;Piecemeal swallowing Oral - Solids Oral - Regular: Piecemeal swallowing;Weak lingual manipulation Oral - Pill: Reduced posterior propulsion (pt unable to propell pill towards posterior pharyngeal wall ) Oral Phase - Comment Oral Phase - Comment: Pill eventually expectorated (could not transport in puree or thin liquid).   Pharyngeal Phase Pharyngeal Phase Pharyngeal Phase: Impaired Pharyngeal - Nectar Pharyngeal - Nectar Cup: Delayed swallow initiation;Premature spillage to valleculae;Reduced pharyngeal peristalsis;Reduced epiglottic inversion;Reduced anterior laryngeal mobility;Reduced laryngeal elevation;Reduced tongue base retraction;Pharyngeal residue - valleculae;Pharyngeal residue - pyriform sinuses;Lateral channel residue Pharyngeal - Nectar Straw: Premature spillage to valleculae;Reduced pharyngeal peristalsis;Reduced epiglottic inversion;Reduced anterior laryngeal mobility;Reduced laryngeal elevation;Reduced tongue base retraction;Pharyngeal residue - valleculae;Pharyngeal residue - pyriform sinuses;Lateral channel residue Pharyngeal - Thin Pharyngeal - Thin Teaspoon: Premature spillage to valleculae;Reduced pharyngeal peristalsis;Reduced epiglottic inversion;Reduced anterior laryngeal mobility;Reduced laryngeal elevation;Pharyngeal residue - valleculae;Lateral channel residue Pharyngeal - Thin Straw: Delayed swallow initiation;Premature spillage to valleculae;Reduced pharyngeal peristalsis;Reduced epiglottic inversion;Reduced anterior laryngeal mobility;Reduced laryngeal elevation;Penetration/Aspiration during swallow;Pharyngeal residue - pyriform sinuses;Pharyngeal residue - valleculae;Lateral channel residue Penetration/Aspiration details (thin straw): Material does not enter airway;Material enters airway, remains ABOVE vocal cords  then ejected out Pharyngeal - Solids Pharyngeal - Puree: Reduced pharyngeal peristalsis;Reduced epiglottic inversion;Reduced anterior laryngeal mobility;Reduced laryngeal elevation;Reduced tongue base retraction;Pharyngeal residue - valleculae;Lateral channel residue Pharyngeal - Regular: Reduced tongue base retraction;Reduced pharyngeal peristalsis;Pharyngeal residue - valleculae  Cervical Esophageal Phase    GO    Cervical Esophageal Phase Cervical Esophageal Phase: [facility]        Thank you,  Havery Moros, CCC-SLP [phone]  PORTER,DABNEY 12/04/2012, 6:20 PM

## 2012-12-04 NOTE — Progress Notes (Signed)
Physical Therapy Treatment Patient Details Name: Anthony Leon MRN: [id number] DOB: May 15, 1918 Today's Date: 12/04/2012 Time: 1130-1202 PT Time Calculation (min): 32 min  PT Assessment / Plan / Recommendation  History of Present Illness     PT Comments   Pt is progressing very well.  He was initially lethargic/sleepy and had difficulty participating in therapeutic exercise to LEs.  He woke up very well with transfer to sit at EOB, needing max assist to perform transfer.  He had independent sitting balance and tolerated this position well.  He was instructed transfer bed to chair and only needed mod assist to complete.  He actually got to the swallow eval chair and was then transported downstairs for this procedure.  He was wide awake and "READY TO GO!"  He will hopefully be able to progress to standing to a walker tomorrow.  Follow Up Recommendations        Does the patient have the potential to tolerate intense rehabilitation     Barriers to Discharge        Equipment Recommendations       Recommendations for Other Services    Frequency     Progress towards PT Goals Progress towards PT goals: Progressing toward goals  Plan      Precautions / Restrictions     Pertinent Vitals/Pain     Mobility  Bed Mobility Bed Mobility: Supine to Sit Supine to Sit: 2: Max assist;HOB elevated Details for Bed Mobility Assistance: pt was able to assist in transfer supine to sit...had no reported pain during the transfer Transfers Transfers: Stand Pivot Transfers Stand Pivot Transfers: 3: Mod assist Ambulation/Gait Ambulation/Gait Assistance: Not tested (comment)    Exercises General Exercises - Lower Extremity Ankle Circles/Pumps: AROM;AAROM;Both;10 reps;Supine Short Arc Quad: AAROM;Both;10 reps;Supine Heel Slides: AAROM;PROM;Both;10 reps;Supine Hip ABduction/ADduction: AAROM;Both;10 reps;Supine   PT Diagnosis:    PT Problem List:   PT Treatment Interventions:     PT Goals  (current goals can now be found in the care plan section)    Visit Information  Last PT Received On: 12/04/12    Subjective Data      Cognition  Cognition Arousal/Alertness: Lethargic Behavior During Therapy: [facility] for tasks assessed/performed Overall Cognitive Status: Within Functional Limits for tasks assessed    Balance  Balance Balance Assessed: Yes Static Sitting Balance Static Sitting - Balance Support: No upper extremity supported;Feet supported Static Sitting - Level of Assistance: 7: Independent  End of Session PT - End of Session Equipment Utilized During Treatment: Gait belt Activity Tolerance: Patient tolerated treatment well Patient left: in chair;with family/visitor present (transferred to swallow eval chair for procedure upcoming) Nurse Communication: Mobility status   GP     Konrad Penta 12/04/2012, 12:08 PM

## 2012-12-05 ENCOUNTER — Inpatient Hospital Stay
Admission: RE | Admit: 2012-12-05 | Discharge: 2013-11-12 | Disposition: E | Payer: Medicare PPO | Source: Ambulatory Visit | Attending: Internal Medicine | Admitting: Internal Medicine

## 2012-12-05 DIAGNOSIS — S72141D Displaced intertrochanteric fracture of right femur, subsequent encounter for closed fracture with routine healing: Secondary | ICD-10-CM

## 2012-12-05 DIAGNOSIS — R05 Cough: Principal | ICD-10-CM

## 2012-12-05 LAB — PROTIME-INR
INR: 1.38 (ref 0.00–1.49)
Prothrombin Time: 16.6 seconds — ABNORMAL HIGH (ref 11.6–15.2)

## 2012-12-05 MED ORDER — DIVALPROEX SODIUM 125 MG PO DR TAB: 125.0000 mg | DELAYED_RELEASE_TABLET | Freq: Three times a day (TID) | ORAL | Status: DC

## 2012-12-05 MED ORDER — BISACODYL 10 MG RE SUPP: 10.0000 mg | Freq: Every day | RECTAL | Status: AC | PRN

## 2012-12-05 MED ORDER — DSS 100 MG PO CAPS: 100.0000 mg | ORAL_CAPSULE | Freq: Two times a day (BID) | ORAL | Status: AC

## 2012-12-05 NOTE — Progress Notes (Signed)
Patient refused to wear CPAP tonight. Hospital unit still at bedside.

## 2012-12-05 NOTE — Clinical Social Work Note (Addendum)
Pt d/c today back to [facility]. Pt's sister and facility aware and agreeable. Pt to transfer with RN. D/C summary will be faxed upon completion. Out of facility DNR sent with pt. [employer] authorization received per Lynnea Ferrier at facility.   Derenda Fennel, [place] [phone]

## 2012-12-05 NOTE — Progress Notes (Signed)
PICC line removed without difficulty, vaseline gauze and 4x4's applied, pressure held x 2 minutes, secured with hypafix tape.

## 2012-12-05 NOTE — Progress Notes (Signed)
Discharged to [facility] in stable condition, out via bed accompanied by staff.

## 2012-12-05 NOTE — Progress Notes (Signed)
Speech Language Pathology Dysphagia Treatment Patient Details Name: Anthony Leon MRN: [id number] DOB: [birth year] Today's Date: Dec 26, 2012 Time: 5784-6962 SLP Time Calculation (min): 20 min  Assessment / Plan / Recommendation Clinical Impression  Pt seen for f/u of diet tolerance. Pt doing much better than one week prior and was tolerating diet upgrade well per patient, nursing, and family. Family reported that he did not eat very much, but that he was not coughing or having difficulty with diet textures. They asked if pt could try thin liquids because they anticipated him wanting soda upon arrival to the [facility]. Provided ice chips, 5ml spoon presentations, and small self-fed cup sips, pt presented audible swallow, double swallow, but clear vocal quality after swallow and no overt s/sx of aspiration and/or penetration. Pt followed directions very well during thin liquid trial. SLP spoke to family, MD, and pt about recommendation to cont with nectar liquids initially for safety, but that pt could most likely tolerate small sips of thin with strict aspiration precautions given SLP and nursing supervision. Anticipated that pt will be able to tolerate upgrade to thin liquids given time and cont strengthening and overall improvement.     Diet Recommendation  Continue with Current Diet: Dysphagia 3 (mechanical soft);Nectar-thick liquid (Pt may have thin sips with nursing/SLP present. )    SLP Plan Continue with current plan of care   Pertinent Vitals/Pain No pain reported.   Swallowing Goals  SLP Swallowing Goals Patient will consume recommended diet without observed clinical signs of aspiration with: Minimal cueing Swallow Study Goal #1 - Progress: Progressing toward goal Patient will utilize recommended strategies during swallow to increase swallowing safety with: Minimal assistance Swallow Study Goal #2 - Progress: Progressing toward goal   Dysphagia Treatment Treatment focused on:  Patient/family/caregiver education;Facilitation of oral preparatory phase;Facilitation of oral phase Family/Caregiver Educated: Comptroller, sister Treatment Methods/Modalities: Skilled observation;Effortful swallow Patient observed directly with PO's: Yes Type of PO's observed: Thin liquids Feeding: Able to feed self;Needs set up Liquids provided via: Teaspoon;Cup Pharyngeal Phase Signs & Symptoms: Suspected delayed swallow initiation;Audible swallow Type of cueing: Verbal Amount of cueing: Minimal   GO     Clarie Camey S 12/26/2012, 11:15 AM

## 2012-12-05 NOTE — Progress Notes (Signed)
Reported to Tonia Ghent, RN at [facility].

## 2012-12-05 NOTE — Discharge Summary (Signed)
Physician Discharge Summary  Anthony Leon:[id number] DOB: 1918/06/18 DOA: 11/26/2012  PCP: No primary provider on file.  Admit date: 11/26/2012 Discharge date: 11/28/2012  Time spent: 40 minutes  Recommendations for Outpatient Follow-up:  1. Follow up with Dr. Romeo Apple in 4 weeks. 2. Weight bearing as tolerated with a walker x 6 weeks  Discharge Diagnoses:  Principal Problem:   Acute encephalopathy Active Problems:   Acute on chronic diastolic heart failure   BPH (benign prostatic hyperplasia)   CVA (cerebral infarction)   HTN (hypertension)   Anxiety state, unspecified   Dementia with behavioral disturbance   Fx intertrochanteric hip   Skin breakdown   Hyponatremia   UTI (urinary tract infection)   Fever, unspecified   Discharge Condition: stable  Diet recommendation: Dysphagia 3 nectar thick  Filed Weights   12/02/12 0500 12/03/12 0419 12/07/2012 0532  Weight: 78.5 kg (173 lb 1 oz) 79 kg (174 lb 2.6 oz) 79.7 kg (175 lb 11.3 oz)    History of present illness:  Anthony Leon is a very pleasant somewhat demented moderately anxious 79 y.o. male who is also a world war 2 veteran with a past medical history that includes hypertension, CHF, anxiety, BPH, stroke in 2006, glaucoma presented to the emergency department on 11/26/12 after a mechanical fall with the chief complaint of hip pain. Patient is a resident of [facility] and has 24-hour safety sitter's due to his frequent agitation and unsteady gait. According to the safety sitter he woke up in the middle of the night was confused about his surroundings attempted to walk and fell onto his buttocks. There was no head injury no loss of consciousness. He immediately began to complain of hip pain. Of note patient's baseline is mostly wheelchair-bound incontinent of bladder and bowel do to urgency he can feed himself and usually tell you what he needs and wants. There was no report of recent fever, nausea vomiting, dysuria  hematuria. The patient's sister did indicate that his appetite has diminished over the last several weeks and that he has lost a total of 5 pounds over the last month unintentionally. In addition he has a tendency towards loose stool but in the usual amounts. No report of melena. Sister also indicates the patient uses nebulizer treatments for intermittent coughing that he's had since his pneumonia in April. She denied any chronic shortness of breath. Patient denied any chest pain palpitation headache visual disturbances. Lab work in the emergency department significant for hemoglobin of 12.2 platelets 148 otherwise unremarkable. Chest x-ray yields no evidence of cardiopulmonary disease. Right hip x-ray shows nondisplaced intertrochanteric right femur fracture. [facility] are asked to admit. Emergency department physician has contacted Dr. Romeo Apple with orthopedic surgery for consultation.      Hospital Course:  Fever, suspected sepsis, UTI: Resolved at discharge. Patient developed signs of sepsis post-operatively. Antibiotics started and then broadened although urine cultures were negative and there was no other evidence of infection. CT of head was unremarkable and ABG unremarkable. No evidence of metabolic disturbance. He gradually improved after antibiotics broadened. Remains hemodynamically stable. Tachycardia resolved. At discharge he is tolerating diet and appears at baseline. Since cultures have shown no growth and he is currently afebrile and clinically improved, we will not discharge him on any antibiotics.   Acute encephalopathy: Suspect delirium superimposed on chronic dementia with behavioral disturbance. Likely multifactorial including hip fracture, surgery, ICU stay, UTI with suspected sepsis. See above. At discharge appears at baseline. Remains cooperative. Verbalizing wants and  needs. CT head without acute abnormality. Phosphorus, magnesium within normal limits.   Right hip fracture  status post mechanical fall: Status post surgery 9/17. To see Dr. Romeo Apple in 4 weeks. Dr. Romeo Apple said ok to resume aggrenox. Staples to be removed. 9/29. Weight bearing as tolerated x 6 weeks  Hypoxia: Etiology unclear. Repeat chest x-rays have been unremarkable. Suspected sleep apnea. His clinical exam also suggests the same. Tolerates CPAP most of night. Will continue at discharge. CPAP of 10cmH2o with 2 LPM [place] bled in per respiratory therapy. Would benefit from sleep study as outpatient if patient can cooperate/tolerate.   Hyponatremia: Resolved   Possible acute diastolic heart failure: Remained clinically compensated.   BPH: Continue Flomax   History of stroke 2006: Resume Aggrenox on 12/04/12.   Nutrition:tolerating dysphagia 3 diet.   Dysphagia: Dysphagia 3 diet with nectar thick liquid   Dementia with behavioral: cooperative at discharge.    Procedures: 2-D echocardiogram: Left ventricular ejection fraction 55%. Mild to moderate LVH. LV diastolic function could not be assessed.  Left hip fracture repair 9/17  Status post PICC line placement 9/19  Consultations: Aztreonam 9/18 >> 12/16/12 Vancomycin 9/18 >> 16-Dec-2012 Bactrim x3 days-stopped on 9/18   Discharge Exam: Filed Vitals:   12/16/2012 0532  BP: 136/61  Pulse: 76  Temp: 98.5 F (36.9 C)  Resp:     General: thin frail NAD Cardiovascular: RRR No MGR No LE edema Respiratory: normal effort. Somewhat shallow. BS distant but clear bilaterally. No wheeze MS: No clubbing no cyanosis. Staples right hip clean and dry  Discharge Instructions     Medication List         acetaminophen 500 MG tablet  Commonly known as:  TYLENOL  Take 1,000 mg by mouth 2 (two) times daily.     albuterol (2.5 MG/3ML) 0.083% nebulizer solution  Commonly known as:  PROVENTIL  Take 2.5 mg by nebulization every 4 (four) hours as needed for wheezing.     amLODipine 5 MG tablet  Commonly known as:  NORVASC  Take 5 mg by mouth daily.      bimatoprost 0.01 % Soln  Commonly known as:  LUMIGAN  Apply 1 drop to eye at bedtime.     bisacodyl 10 MG suppository  Commonly known as:  DULCOLAX  Place 1 suppository (10 mg total) rectally daily as needed.     calcium-vitamin D 500-200 MG-UNIT per tablet  Commonly known as:  OSCAL WITH D  Take 1 tablet by mouth daily.     clonazePAM 0.5 MG tablet  Commonly known as:  KLONOPIN  Take one tablet by mouth every evening for anxiety     cycloSPORINE 0.05 % ophthalmic emulsion  Commonly known as:  RESTASIS  Place 2 drops into both eyes 2 (two) times daily.     dipyridamole-aspirin 200-25 MG per 12 hr capsule  Commonly known as:  AGGRENOX  Take 1 capsule by mouth 2 (two) times daily.     divalproex 125 MG capsule  Commonly known as:  DEPAKOTE SPRINKLE  Take 375 mg by mouth at bedtime.     DSS 100 MG Caps  Take 100 mg by mouth 2 (two) times daily.     ENSURE  Take 237 mLs by mouth 2 (two) times daily between meals.     fluticasone 50 MCG/ACT nasal spray  Commonly known as:  FLONASE  Place 2 sprays into the nose daily.     guaiFENesin 600 MG 12 hr tablet  Commonly known  as:  MUCINEX  Take 1,200 mg by mouth 2 (two) times daily as needed for congestion.     metoprolol tartrate 25 MG tablet  Commonly known as:  LOPRESSOR  Take 12.5 mg by mouth 2 (two) times daily.     Propylene Glycol 0.6 % Soln  Place 1 drop into both eyes 3 (three) times daily.     risperiDONE 0.25 MG tablet  Commonly known as:  RISPERDAL  Take 0.25 mg by mouth daily. Takes at 4pm daily     sodium chloride 0.65 % nasal spray  Commonly known as:  OCEAN  Place 2 sprays into the nose 2 (two) times daily.     tamsulosin 0.4 MG Caps capsule  Commonly known as:  FLOMAX  Take 0.4 mg by mouth daily.       Allergies  Allergen Reactions  . Ciprofloxacin Other (See Comments)    unknown  . Penicillins Other (See Comments)    unknown  . Tetracyclines & Related Other (See Comments)    unknown  .  Tramadol Other (See Comments)    unknown       Follow-up Information   Follow up with Fuller Canada, MD. Schedule an appointment as soon as possible for a visit in 4 weeks. (will need xray at office. )    Specialties:  Orthopedic Surgery, Radiology   Contact information:   [address], [address] [place] Rib[place] [address] [phone]        The results of significant diagnostics from this hospitalization (including imaging, microbiology, ancillary and laboratory) are listed below for reference.    Significant Diagnostic Studies: Dg Hip Bilateral W/pelvis  11/26/2012   CLINICAL DATA:  Right-sided head pain after fall.  EXAM: BILATERAL HIP WITH PELVIS - 4+ VIEW  COMPARISON:  09/03/2009.  FINDINGS: There is an oblique lucency extending from the greater trochanter cortex towards the lesser trochanter. The right hip is located.  Remote intertrochanteric left femur fracture status post ORIF. No periprosthetic fracture or other adverse feature.  No evidence of pelvic ring fracture.  Osteopenia.  IMPRESSION: Nondisplaced intertrochanteric right femur fracture.   Electronically Signed   By: Tiburcio Pea   On: 11/26/2012 05:33   Dg Hip Operative Right  11/28/2012   CLINICAL DATA:  Right hip fracture. Gamma nail fixation.  EXAM: DG OPERATIVE RIGHT HIP  TECHNIQUE: A single spot fluoroscopic AP image of the right hip is submitted.  COMPARISON:  Radiographs 11/26/2012.  FINDINGS: Seven spot fluoroscopic images demonstrate gamma nail fixation of the proximal right femur. The hardware appears well positioned. No complications are identified.  IMPRESSION: Proximal right femoral fixation without demonstrated complication.   Electronically Signed   By: Roxy Horseman   On: 11/28/2012 16:40   Ct Head Wo Contrast  12/02/2012   CLINICAL DATA:  1. No acute intracranial abnormalities.  EXAM: CT HEAD WITHOUT CONTRAST  TECHNIQUE: Contiguous axial images were obtained from the base  of the skull through the vertex without intravenous contrast.  COMPARISON:  09/15/2009  FINDINGS: There is mild diffuse low-attenuation within the subcortical and periventricular white matter compatible with chronic microvascular disease. The prominence of the sulci and ventricles are identified compatible with brain atrophy. No midline shift, ventriculomegaly, mass effect, evidence of mass lesion, intracranial hemorrhage or evidence of cortically based acute infarction. Gray-white matter differentiation is within normal limits throughout the brain. There is opacification of the frontal sinus. There is also opacification of the left mastoid air cells.  Partial opacification of the right mastoid air cells noted. The calvarium is intact. .  IMPRESSION: 1. No acute intracranial abnormalities. 2. Small vessel ischemic change and brain atrophy. 3. Sinus and mastoid air cell opacification   Electronically Signed   By: Signa Kell M.D.   On: 12/02/2012 11:45   Dg Chest Port 1 View  11/30/2012   CLINICAL DATA:  PICC placement.  EXAM: PORTABLE CHEST - 1 VIEW  COMPARISON:  Single view of the chest 11/29/2012 and 11/27/2012.  FINDINGS: The patient has a right PICC in place with tip projecting over the superior cavoatrial junction. The line could be withdrawn 2 to 3 cm for better positioning. Calcified pleural plaques are again noted. Bibasilar atelectasis is seen. No pneumothorax. Heart size is normal.  IMPRESSION: Tip of right PICC projects over the superior cavoatrial junction. The line could be withdrawn 2-3 cm for better positioning.   Electronically Signed   By: Drusilla Kanner M.D.   On: 11/30/2012 14:02   Dg Chest Port 1 View  11/29/2012   CLINICAL DATA:  Fever, tachypnea  EXAM: PORTABLE CHEST - 1 VIEW  COMPARISON:  11/27/2012  FINDINGS: Cardiomediastinal silhouette is stable. Extensive bilateral calcified pleural plaques again noted. Stable bilateral basilar atelectasis or scarring. No segmental infiltrate or  convincing pulmonary edema.  IMPRESSION: Extensive bilateral calcified pleural plaques again noted. Stable bilateral basilar atelectasis or scarring. No segmental infiltrate or convincing pulmonary edema.   Electronically Signed   By: Natasha Mead   On: 11/29/2012 09:49   Dg Chest Port 1 View  11/27/2012   CLINICAL DATA:  79 year old male with shortness of breath and lethargy.  EXAM: PORTABLE CHEST - 1 VIEW  COMPARISON:  11/26/2012 and earlier.  FINDINGS: Portable AP upright view at 1217 hrs. Slightly lower lung volumes. Stable cardiac size and mediastinal contours. Visualized tracheal air column is within normal limits. No pneumothorax. No definite effusion. Opacity from extensive bilateral calcified pleural disease re- identified. No new pulmonary opacity identified.  IMPRESSION: Extensive calcified pleural disease. No new cardiopulmonary abnormality identified.   Electronically Signed   By: Augusto Gamble M.D.   On: 11/27/2012 12:40   Dg Chest Port 1 View  11/26/2012   CLINICAL DATA:  Fall with hip fracture.  EXAM: PORTABLE CHEST - 1 VIEW  COMPARISON:  08/24/2012.  FINDINGS: Diffuse calcified pleural plaques. When accounting for these extensive opacities, no definite effusion, infiltrate, edema, or pneumothorax. Mild, chronic cardiomegaly. Aortic atherosclerosis without acute upper mediastinal contour abnormality.  No definite fracture. Osteopenia.  IMPRESSION: 1. No evidence of acute cardiopulmonary disease. 2. Extensive bilateral calcified pleural plaque.   Electronically Signed   By: Tiburcio Pea   On: 11/26/2012 06:00   Dg Abd Portable 1v  12/01/2012   CLINICAL DATA:  Abdominal discomfort  EXAM: PORTABLE ABDOMEN - 1 VIEW  COMPARISON:  September 03, 2009  FINDINGS: The bowel gas pattern is normal. No obstruction or free air is seen on this supine examination. There is postoperative change in the gallbladder fossa region as well as in both proximal femurs. There is vascular calcification in the pelvis.   IMPRESSION: Bowel gas pattern unremarkable.   Electronically Signed   By: Bretta Bang   On: 12/01/2012 16:42    Microbiology: Recent Results (from the past 240 hour(s))  SURGICAL PCR SCREEN     Status: None   Collection Time    11/26/12  3:30 PM      Result Value Range Status   MRSA, PCR  NEGATIVE  NEGATIVE Final   Staphylococcus aureus NEGATIVE  NEGATIVE Final   Comment:            The Xpert SA Assay (FDA     approved for NASAL specimens     in patients over 66 years of age),     is one component of     a comprehensive surveillance     program.  Test performance has     been validated by [employer] for patients greater     than or equal to 55 year old.     It is not intended     to diagnose infection nor to     guide or monitor treatment.  URINE CULTURE     Status: None   Collection Time    11/27/12 12:30 PM      Result Value Range Status   Specimen Description URINE, CLEAN CATCH   Final   Special Requests NONE   Final   Culture  Setup Time     Final   Value: 11/27/2012 18:51     Performed at [facility] Count     Final   Value: NO GROWTH     Performed at [facility]   Culture     Final   Value: NO GROWTH     Performed at [facility]   Report Status 11/28/2012 FINAL   Final  URINE CULTURE     Status: None   Collection Time    11/29/12  3:00 PM      Result Value Range Status   Specimen Description URINE, CATHETERIZED   Final   Special Requests NONE   Final   Culture  Setup Time     Final   Value: 11/29/2012 19:00     Performed at [facility] Count     Final   Value: NO GROWTH     Performed at [facility]   Culture     Final   Value: NO GROWTH     Performed at [facility]   Report Status 12/01/2012 FINAL   Final     Labs: Basic Metabolic Panel:  Recent Labs Lab 11/29/12 0602 11/30/12 0509 12/01/12 0510 12/02/12 0501 12/03/12 0557  NA 129* 130* 131* 131* 135  K 4.1 4.1  4.0 3.6 3.8  CL 99 101 99 96 101  CO2 19 21 24 26 24   GLUCOSE 149* 111* 91 88 87  BUN 21 18 23  30* 32*  CREATININE 1.30 1.24 1.30 1.26 1.03  CALCIUM 7.9* 8.2* 8.2* 8.4 8.5  MG  --   --   --  2.1  --   PHOS  --   --   --  3.0  --    Liver Function Tests: No results found for this basename: AST, ALT, ALKPHOS, BILITOT, PROT, ALBUMIN,  in the last 168 hours No results found for this basename: LIPASE, AMYLASE,  in the last 168 hours No results found for this basename: AMMONIA,  in the last 168 hours CBC:  Recent Labs Lab 11/29/12 0602 11/30/12 0509 12/01/12 0510 12/02/12 0501  WBC 8.1 7.6 7.5 6.3  HGB 11.1* 10.8* 10.7* 10.9*  HCT 32.3* 31.1* 31.2* 33.2*  MCV 92.6 92.3 94.5 95.7  PLT 121* 128* 154 169   Cardiac Enzymes:  Recent Labs Lab 11/30/12 0759  TROPONINI <0.30   BNP: BNP (last 3 results)  Recent  Labs  11/30/12 0759  PROBNP 9308.0*   CBG: No results found for this basename: GLUCAP,  in the last 168 hours     Signed:  Gwenyth Bender  [facility] 01-03-2013, 9:45 AM  Attending note:  Patient seen and examined.  Agree with note as above per Toya Smothers, NP.  Patient has clinically improved.  He is s/p hip surgery.  Encephalopathy has improved.  He is ready to discharge to SNF for physical therapy.  MEMON,JEHANZEB

## 2012-12-06 ENCOUNTER — Other Ambulatory Visit: Payer: Self-pay | Admitting: *Deleted

## 2012-12-06 MED ORDER — CLONAZEPAM 0.5 MG PO TABS: ORAL_TABLET | ORAL | Status: DC

## 2012-12-10 ENCOUNTER — Non-Acute Institutional Stay (SKILLED_NURSING_FACILITY): Payer: Medicare PPO | Admitting: Internal Medicine

## 2012-12-10 ENCOUNTER — Ambulatory Visit (HOSPITAL_COMMUNITY)
Admit: 2012-12-10 | Discharge: 2012-12-10 | Disposition: A | Discharge status: Skilled Nursing Facility | Payer: Medicare PPO | Source: Skilled Nursing Facility | Attending: Internal Medicine | Admitting: Internal Medicine

## 2012-12-10 DIAGNOSIS — R059 Cough, unspecified: Secondary | ICD-10-CM | POA: Insufficient documentation

## 2012-12-10 DIAGNOSIS — I10 Essential (primary) hypertension: Secondary | ICD-10-CM

## 2012-12-10 DIAGNOSIS — R404 Transient alteration of awareness: Secondary | ICD-10-CM

## 2012-12-10 DIAGNOSIS — F0391 Unspecified dementia with behavioral disturbance: Secondary | ICD-10-CM

## 2012-12-10 DIAGNOSIS — F03918 Unspecified dementia, unspecified severity, with other behavioral disturbance: Secondary | ICD-10-CM

## 2012-12-10 DIAGNOSIS — R05 Cough: Secondary | ICD-10-CM | POA: Insufficient documentation

## 2012-12-10 DIAGNOSIS — R918 Other nonspecific abnormal finding of lung field: Secondary | ICD-10-CM | POA: Insufficient documentation

## 2012-12-10 NOTE — Progress Notes (Signed)
Patient ID: Anthony Leon, male   DOB: 1919-02-04, 79 y.o.   MRN: [id number]  Facility; [facility] Chief complaint; admission to SNF post admit to [facility] from September 15 to September 24  History ; [name] is a long-standing patient in this facility. He was apparently Op[place] to walk to the bathroom in the middle of the night and fell. He was admitted to hospital with a right hip fracture. He underwent an ORIF for per he did not have any head injury or loss of consciousness. Postoperatively he developed delirium. He was felt to have a UTI however the culture was negative for. CT scan of the head was unremarkable. Chest x-ray done in the emergency department showed no evidence of cardiopulmonary disease. He didn't develop hyponatremia which apparently resolved the. His diastolic heart failure remained compensated.  Past Medical History  Diagnosis Date  . CHF (congestive heart failure)   . Glaucoma   . Hypertension   . Urinary retention   . Agitation   . Confusion   . DVT (deep venous thrombosis)   . BPH (benign prostatic hyperplasia)   . Delirium   . CVA (cerebral infarction)    Past Surgical History  Procedure Laterality Date  . Foot fracture surgery    . Left femur fracture repair    . Intramedullary (im) nail intertrochanteric Right 11/28/2012    Procedure: INTRAMEDULLARY (IM) NAIL INTERTROCHANTRIC;  Surgeon: Vickki Hearing, MD;  Location: [facility];  Service: Orthopedics;  Laterality: Right;   Discharge medications; Aggrenox 1 tablet twice a day, albuterol nebulizers every 4 hours when necessary, amlodipine 5 mg once a day, Os-Cal 500+ D5 100/200 once a day, Colace 100 twice a day, Depakote sprinkles 125 3 capsules at bedtime, Flomax 0.4 each bedtime, Flonase 2 sprays each nostril daily, Klonopin 0.5 daily, Lumigan 0.01% one drop at bedtime, but Toprolol 25 mg one half tablet 12.5 mg twice a day, Restasis twice a day, Risperdal 0.25 daily,  Review of systems; mostly from a  sitter she finds it more confused since his surgery although admittedly he was declining before this occurred  Physical examination O2 sat 95% on room air pulse rate 74 respirations 24 and Cheyne-Stokes Respiratory clear entry bilaterally Cardiac heart sounds are normal if anything perhaps slightly volume contracted Abdomen no liver no spleen no tenderness no masses GU some suprapubic and perhaps some left costovertebral angle tenderness. Extremities no evidence of DVT Neurologic quick screening done does not reveal any lateralizing findings. Mental status; patient awakens to voice however he seems to fade off the bed as I am examining him.  Impression/plan #1 postoperative delirium on the background of dementia. I will rescreen him. #2 Cheyne-Stokes respirations. Certainly no evidence of heart failure at the bedside I will check a chest x-ray. #3 hyponatremia postoperatively will need to be rechecked to. #4 right hip fracture status post ORIF for. No evidence of DVT he appears to tolerated this quite well.  Lab work will be ordered. I will review him again in 48 hours so. The delirium could be resolving, postoperative delirium sometimes has a protracted course.

## 2012-12-12 DEATH — deceased

## 2012-12-13 ENCOUNTER — Non-Acute Institutional Stay (SKILLED_NURSING_FACILITY): Payer: Medicare PPO | Admitting: Internal Medicine

## 2012-12-13 DIAGNOSIS — I5032 Chronic diastolic (congestive) heart failure: Secondary | ICD-10-CM

## 2012-12-13 DIAGNOSIS — S72009D Fracture of unspecified part of neck of unspecified femur, subsequent encounter for closed fracture with routine healing: Secondary | ICD-10-CM

## 2012-12-13 DIAGNOSIS — F05 Delirium due to known physiological condition: Secondary | ICD-10-CM

## 2012-12-13 DIAGNOSIS — I509 Heart failure, unspecified: Secondary | ICD-10-CM

## 2012-12-13 DIAGNOSIS — F03918 Unspecified dementia, unspecified severity, with other behavioral disturbance: Secondary | ICD-10-CM

## 2012-12-13 DIAGNOSIS — R627 Adult failure to thrive: Secondary | ICD-10-CM

## 2012-12-13 DIAGNOSIS — I4891 Unspecified atrial fibrillation: Secondary | ICD-10-CM

## 2012-12-13 DIAGNOSIS — F0391 Unspecified dementia with behavioral disturbance: Secondary | ICD-10-CM

## 2012-12-13 NOTE — Progress Notes (Signed)
Patient ID: Anthony Leon, male   DOB: 07-01-1918, 79 y.o.   MRN: [id number]  This is an acute visit.  Level of care skilled.  Facility [facility].  Date 12/13/2012.  Chief complaint-acute visit secondary to dementia with behavior/failure to thrive.  History of present illness  Patient is a pleasant 79 year old male who has been quite stable in the past but has gradually had a decline in his clinical status.  This includes some increased periods of dementia with agitation and behaviors.  He has been seen by psychiatric services and has been started recently on Depakote he is also on Risperdal.  He is recently returned from a fairly lengthy hospitalization for a right hip fracture after a fall.  This was complicated with ??UTI that was treated aggressively with antibiotics.  A CT of the head did not show anything acute.  It was not any evidence of metabolic disturbances.  Thoughthis encephalopathy was caused by delirium superimposed on chronic dementia with behavioral disturbance.  He did require a stay in the ICU apparently there were issues with ?? atrial fibrillation and uncontrolled heart rate which appears to have stabilized.  He continues to eat and drink apparently fairly poorly he is on nectar thick liquids and apparently he does not really like this ---he is also refusing his medications tonight.  His sister has come to the facility and is quite concerned and I had a lengthy discussion with her about his prognosis  Workup in the hospital was quite extensive-and his hospital discharge states suspect this is multifactorial with recent in hip fracture compounded with his underlying dementia-.  His vital signs continued to be stable he does have quite poor intake it appears eating at best a quarter of his meals although apparently he takes a supplement somewhat better.  According to nursing staff and his sitter he is refusing his medications tonight.  Family medical social  history as been reviewed per discharge summary on 11/29/2012.--In admission note on 12/10/2012.  Medications have been reviewed. Per MAR.  Review of systems-limited secondary to patient being poor historian he says he is having some pain at times although not specifically to any one body part.  It is not complaining of any chest pain or shortness of breath.  Physical exam.  He is afebrile pulse of 92 respirations 18 blood pressure 100/54-109/56 most recently his weight 163 This is down about 10 pounds since June.  In general this is a frail elderly male he appears quite weak but in no distress lying in bed-he appears to recognize me.  His skin is warm and dry.  Eyes pupils appear equal round reactive to light.  Oropharynx appears fairly moist.  Heart is irregular irregular rate and rhythm without murmur gallop or rub.  Chest is clear to auscultation with somewhat shallow air entry poor effort.  Abdomen is soft nontender with positive bowel sounds.  Extremities he has arthritic changes i--general frailty he does not really have significant edema He is able to move his upper extremities bilaterally significant weakness of his lower extremities.  Neurologic-appears grossly intact his speech is clear do not see any lateralizing findings.  Psych he is oriented to self appear to recognize me-was pleasant and with some coaxing actually did take his medicines while I was at bedside.  Labs.  12/02/2012.  WBC 6.3 hemoglobin 10.9 platelets 169.  12/03/2012.  Sodium 135 potassium 3.8 BUN 32 creatinine 1.03.  11/30/2012-TSH 2.26.  Pro [id number].  Assessment and plan.  #1-history of  dementia with behaviors-he does continue on Depakote as well as Risperdal and has Klonopin at night for anxiety-he is followed by psych services-this appears to be progressing I suspect was exacerbated somewhat by his recent hospitalization-concerning poor by mouth intake and I did have an extensive  discussion with his responsible party his sister about this.  I suspect his progressing dementia is contributing to all these issues-in fact when staff was giving him  his medicines and providing him with sips of water he appeared to have difficulty remembering how to swallow.  Per discussion with his sister we will order labs tomorrow --we'll await those results beforer doing anything aggressive including IV's-apparently it was very difficult to keep an IV and during his hospitalization and family does not want any artificial feeding tube or PICC line.  Certainly encourage by mouth intake and fluid intake he does have nectar thick liquids order secondary to concern certainly of aspiration.  At some point family may optt to liberalize his diet realizing the implications that could be pneumonia which could result in patient's demise-at this point the sister is fairly comfortable with this but would like to see what the next couple days brings-and we certainly will wait on that.  At this point we'll stick to ordering labs--BMP_ and monitoring him and pushing by mouth intake as aggressively as possible.  #2-history of right hip fracture-this appears stable he is on calcium supplementation and is receiving Tylenol for pain apparently this is pretty effective although  have to monitor this I don't believe he does very well with stronger pain medication --after initially complaining of some pain on exam he later did not complain of pain.  #3-history of CVA-he does continue on Aggrenox for anticoagulation.  4 CHF -this has not really appeared be an issue at this time.  2-D echo showed an ejection fraction of 55%.  #5-apparently some history of atrial fibrillation in the past he is on a beta blocker at this point appears rate controlled apparently there were issues in the hospital.  [id number]-of note greater than 40 minutes spent assessing patient--reviewing numerous medical records including hospital  notes-discussing patient's clinical status with his sister in the facility and  at bedside--and coordinating and formulating a plan of care.  Of note greater than 50% of time spent coordinating and formulating plan of care with family involvement.

## 2012-12-18 ENCOUNTER — Non-Acute Institutional Stay (SKILLED_NURSING_FACILITY): Payer: Medicare PPO | Admitting: Internal Medicine

## 2012-12-18 DIAGNOSIS — R609 Edema, unspecified: Secondary | ICD-10-CM

## 2012-12-18 DIAGNOSIS — R220 Localized swelling, mass and lump, head: Secondary | ICD-10-CM

## 2012-12-18 DIAGNOSIS — I959 Hypotension, unspecified: Secondary | ICD-10-CM

## 2012-12-18 NOTE — Progress Notes (Signed)
Patient ID: Anthony Leon, male   DOB: 26-Dec-1918, 79 y.o.   MRN: [id number]  This is an acute visit.  Level of care skilled.  Facility [facility]  The dates 12/18/2012.  Chief complaint-acute visit secondary to low blood pressure-facial nodules.  History of present also.  Patient is a pleasant elderly resident recently hospitalized for a hip fracture.  Apparently he's been relatively his baseline since return from the hospital late last month.  Apparently nursing staff has noted some low blood pressures of 90/40-88/40 --am not sure if these were done by machine for manually-apparently he has not been overtly symptomatic.  He is on Norvasc 5 mg a day as well as Lopressor 12.5 mg twice a day.  I did take his blood pressure tonight and got 150/70 his blood pressure medicine had been held earlier in the day.  He does not complaining of any dizziness syncopal-type feelings he is a poor historian somewhat secondary to dementia.  Nursing staff is also noted a nodule in the left cheek area as well as back of his neck he would like assessed.  Apparently this is not warm or tender.--He is afebrile--he did not complain of any trouble swallowing or mouth pain  Family medical social history as been reviewed per. Reignition note on 12/10/2012.  Medications have been reviewed per MAR.  Review of systems.  Somewhat limited secondary to dementia.  Gen. does not complaining of fever chills.  Head ears eyes nose mouth and throat-nodule as noted in history of present illness he does not complaining of any sore throat or swallowing difficulties or visual changes  Respiratory does not complain of cough or shortness of breath.  Cardiac-no complaints of chest pain does have some history of arrhythmia.  GI-no complaints nausea vomiting or abdominal discomfort.  Neurologic does not complaining of headache or dizziness tonight.  Psych he does have dementia this appears to be slowly progressing with  increased confusion and agitation at times  Physical exam.  Temperature is 97.0 pulse 80 respirations 19 blood pressure 150/70 taken manually again he has had lower blood pressures earlier in the day apparently.  In general this is a somewhat frail elderly male in no distress lying comfortably in bed.  The skin is warm and dry.  I do note left cheek area anterior to the ear there is a nodule  . not erythematous does not appear to be overtly tender--just mildly tender.  Also there is a small nodule-question lymph node posterior neck this also was not tender.  His oropharynx is clear his mucous membranes are moist I do not see anything on interior left cheek that would be acutely abnormal  Chest is clear to auscultation without labored breathing.  Heart is regular rate and rhythm with occasional irregular beat without murmur gallop or rub.  Abdomen soft nontender with positive bowel sounds.  I do not note any lower extremity edema he moves his extremities at baseline does have lower extremity weakness compared to upper extremities at his baseline.  Neurologic grossly intact--no lateralizing findings  Psych- he is oriented to self only appears to be fairly appropriate when answering questions although he does have increased confusion at times   Labs.  12/14/2012.  Sodium 141 potassium 4.3 BUN 21 creatinine 1.13.  12/12/2012.  WBC 8.4 hemoglobin 12.0 platelets 283.  Assessment and plan.  #1-edema of parotid area-also neck-there could be some lymphadenopathy.-It appears the largest nodule on the left cheek is in the parotid gland area-will treat empirically  this time for parotid inflammation although not totally convinced since it is not  acutely tender which is a typical presentation -will treat empirically with Ceftin 500 mg twice a day for 7 days and monitor -also will bear followup by Dr. Leanord Hawking when he is in the facility tomorrow morning--  . #2-hypotension-I suspect this  is episodic-I would be very hesitant to stop his Lopressor-will write orders to hold Norvasc for systolic less than 120 and hold the Norvasc and Lopressor for systolic less than 100-I note his systolic is actually mildly elevated tonight this could very well be due to the holding of blood pressure medications earlier in the day -this will have to be monitored with vital signs every shift  [id number]-of note greater than 30 minutes spent assessing patient-discussing his status with nursing staff and coordinating and formulating a plan of care

## 2012-12-19 ENCOUNTER — Non-Acute Institutional Stay (SKILLED_NURSING_FACILITY): Payer: Medicare PPO | Admitting: Internal Medicine

## 2012-12-19 DIAGNOSIS — R599 Enlarged lymph nodes, unspecified: Secondary | ICD-10-CM

## 2012-12-19 DIAGNOSIS — R590 Localized enlarged lymph nodes: Secondary | ICD-10-CM

## 2012-12-19 NOTE — Progress Notes (Signed)
Patient ID: Anthony Leon, male   DOB: 04-16-1918, 79 y.o.   MRN: [id number] [facility] Chief complaint; question parotitis  History; I been asked to look at this man who is long-standing resident of this facility. He was seen by our staff yesterday and noted to have some swelling in the left parotid area. He was started on Ceftin and I been asked to see him because of this. The patient is really not in any pain which already makes it unlikely that this is true parotitis.  Past medical history is reviewed there really is nothing of relevance.  Review of systems HEENT he is not complaining of oral pain, odontophagia or dysphagia  Physical examination; HEENT I see really no oral lesions, his oropharynx looks normal Lymph there is indeed a swelling over the left parotid area rubbery feeling nodule. This is not tender and may very well be a lymph node or the parotid gland. He also has what appears to be a lymph node over the left mastoid process there is small lymph nodes bilaterally in the posterior triangle on both sides. Patient was sitting up eating lunch but didn't have a chance to do a more complete exam.  Impression/plan Lymphadenopathy. The cause of this is not really clear. CBC from October 1 showed a hemoglobin of 12 a white count of 8.4 a platelet count of 283,000 his differential count is normal. Comprehensive metabolic panel showed a pulse loss of 124. His AST and ALT are normal albumin is 2.9 calcium is 8.8 the. I note he is on valproic acid. He is been put on Ceftin although I don't believe this is acute parotitis as this is usually a very painful condition. Also note the enlarged lymph nodes. Will need a chance to do a more complete physical exam. I am not certain that we would want to pursue a more aggressive workup of this in this man which would include extensive imaging studies and perhaps a biopsy if this does not settle.

## 2012-12-20 ENCOUNTER — Non-Acute Institutional Stay (SKILLED_NURSING_FACILITY): Payer: Medicare PPO | Admitting: Internal Medicine

## 2012-12-20 DIAGNOSIS — R591 Generalized enlarged lymph nodes: Secondary | ICD-10-CM

## 2012-12-20 DIAGNOSIS — R599 Enlarged lymph nodes, unspecified: Secondary | ICD-10-CM

## 2012-12-20 DIAGNOSIS — I959 Hypotension, unspecified: Secondary | ICD-10-CM

## 2012-12-20 DIAGNOSIS — R609 Edema, unspecified: Secondary | ICD-10-CM

## 2012-12-20 DIAGNOSIS — R220 Localized swelling, mass and lump, head: Secondary | ICD-10-CM

## 2012-12-20 NOTE — Progress Notes (Signed)
Patient ID: Anthony Leon, male   DOB: [birth year], 79 y.o.   MRN: [id number]  This is an acute visit.  Level of care skilled care  Facility [facility].  Chief complaintacut. visit followup hypotension-adenopathy-question parotiditis  History of present illness  Patient is a pleasant elderly resident who we saw earlier this week for some swelling most prominently left parotid area-although it was not really painful he was started on antibiotic for concerns is possibly may be the start of the parotid inflammation.  He also was noted to be hypotensive however he was asymptomatic and when I took his blood pressure appeared to be improved-we did write orders to hold his Norvasc for systolic blood pressure less than 120 and Norvasc and Lopressor for systolics below 100-he is on Lopressor 12.5 twice a day Norvasc 5 mg a day.  Tonight he was refusing his meds however with some coaxing he did take them.  Apparently his Lopressor and Norvasc were held tonight secondary to blood pressure of 95/56-I did recheck it manually later in the evening and blood pressure was 140/65.  He does not appear his blood pressure medications were held routinely which is important since I would be hesitant that he be taken off his Lopressor on any regular basis.  Clinically appears stable I did reassess swelling in this appears to be somewhat improved I would say the left parotid area he also noted to have most likely adenopathy in the posterior triangle area which does persist although not as prominent as on the parotid area.  Family medical social history again reviewed.  Medications have been reviewed.  Review of systems.  Limited secondary to dementia but he is not complaining of any chest pain shortness of breath or dizziness-he has not had any fever or chills.  Physical exam.  Temperature is 97.4 pulse 75 respirations 18 blood pressure initially 95/56-on recheck later this evening 150/65 and I did take this  manually.  General is a somewhat frail elderly male in no distress resting comfortably in bed.  Skin is warm and dry.  Head-he continues with some swelling in his parotid area but this is nontender and non-erythematous appears to be down somewhat from exam earlier this week he also continues to have some mild adenopathy posterior triangle area.  Heart is regular rate and rhythm with occasional irregular beat.  Chest is clear to auscultation no labored breathing.  Abdomen is soft nontender with positive bowel sounds.  Labs.  12/14/2012.  Sodium 141 potassium 4.3 BUN 21 creatinine 1.13.  12/12/2012.  WbC 8.4 hemoglobin 12.0 platelets 283.  Assessment and plan.  #1-parotid swelling-this does not fit the presentation of typical parotid infection it is not tender-has been put on Ceftin empirically for concerns that there may be some element of inflammation here-he appears to be tolerating the antibiotic fine this appears to be somewhat improved.  In regards to the general adenopathy-as noted by Dr. Leanord Hawking yesterday an option would be fairly aggressive workup of this although I suspect with previous discussions with his family that it would be highly unlikely they would pursue this-- he is clinically stable  Dr. Leanord Hawking has ordered a CBC with differential on Monday, October 13  #2-hypotension-this appears to be sporadic-- have  Encouraged nursing staff to check these blood pressures manually when at all possible-again he appears to be clinically stable-again would be very hesitant to not give him Lopressor fairly regularly-he does have some history of arrhythmia AFIB  [id number]  .

## 2012-12-23 DIAGNOSIS — I959 Hypotension, unspecified: Secondary | ICD-10-CM | POA: Insufficient documentation

## 2012-12-23 DIAGNOSIS — R609 Edema, unspecified: Secondary | ICD-10-CM | POA: Insufficient documentation
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
## 2012-12-24 ENCOUNTER — Non-Acute Institutional Stay (SKILLED_NURSING_FACILITY): Payer: Medicare PPO | Admitting: Internal Medicine

## 2012-12-24 DIAGNOSIS — R599 Enlarged lymph nodes, unspecified: Secondary | ICD-10-CM

## 2012-12-24 DIAGNOSIS — C8581 Other specified types of non-Hodgkin lymphoma, lymph nodes of head, face, and neck: Secondary | ICD-10-CM

## 2012-12-24 DIAGNOSIS — C8591 Non-Hodgkin lymphoma, unspecified, lymph nodes of head, face, and neck: Secondary | ICD-10-CM

## 2012-12-24 NOTE — Progress Notes (Signed)
Patient ID: Anthony Leon, male   DOB: [birth year], 79 y.o.   MRN: [id number]  [facility]ility [facility] Chief complaint; follow up lymphadenopathy;  History; I been asked to look at this man who is long-standing resident of this facility. I had seen him last week when his swelling was noted in his parotid area. I noted more extensive lymph nodes in his bilateral cervical chain and had him in bed today for a followup examination. He has not been systemically unwell. Lab work from this morning showed a white count of 6.4 hemoglobin of 11.3 and a platelet count of 177,000. His differential shows 31% neutrophils 49% lymphocytes [slightly elevated] as well as 13% monocytes.   Past medical history is reviewed there really is nothing of relevance.  Review of systems HEENT he is not complaining of oral pain, odontophagia or dysphagia  Physical examination; HEENT I see really no oral lesions, his oropharynx looks normal Lymph there is swelling in the inferior aspect of the right parotid area. I don't think this is the parotid gland. There is also palpable nodes on the right maxillary nodes. There is bilateral posterior cervical nodes right occipital nodes which are small and rubbery. There is massive lymphadenopathy involving the bilateral axilla and also inguinal areas these are firm and matted but nontender. Cardiac; heart sounds are normal there is no murmurs he does not appear to be dehydrated Respiratory clear entry bilaterally Abdomen no spleen is palpable. Soft smooth liver edge at the lower cost margin.  Impression/plan Lymphadenopathy. This is massive and widespread. I suspect this is going to turn out to be some form of lymphoma as the lymphadenopathy involves his neck, axilla and inguinal areas. The lymphadenopathy in the axilla is massive bilateral. There is a real ethical issue about how to approach this in a 79 year old man with dementia. I have phoned his sister who is his power of attorney and I'm  going to meet with her today to discuss the approach to this. An aggressive medical approach would be a CT scan of his neck, chest and abdomen. Ultimately this would require a biopsy, any of the involved areas such as that visit as his axilla would be amenable to this. However I suspect this is going to be some form of  lymphoma. He would not be a good candidate for chemotherapy. I have discussed this in detail with his sister who is his POA. We have agreed for him to be comfort care in the facility. There is no need for hospice at present but his sister requests this when he appears to be declining.

## 2012-12-25 ENCOUNTER — Non-Acute Institutional Stay (SKILLED_NURSING_FACILITY): Payer: Medicare PPO | Admitting: Internal Medicine

## 2012-12-25 DIAGNOSIS — F411 Generalized anxiety disorder: Secondary | ICD-10-CM

## 2012-12-25 DIAGNOSIS — F03918 Unspecified dementia, unspecified severity, with other behavioral disturbance: Secondary | ICD-10-CM

## 2012-12-25 DIAGNOSIS — F0391 Unspecified dementia with behavioral disturbance: Secondary | ICD-10-CM

## 2012-12-26 ENCOUNTER — Other Ambulatory Visit: Payer: Self-pay | Admitting: *Deleted

## 2012-12-26 MED ORDER — CLONAZEPAM 0.5 MG PO TABS: ORAL_TABLET | ORAL | Status: DC

## 2012-12-28 NOTE — Progress Notes (Signed)
Patient ID: Anthony Leon, male   DOB: 1918/10/20, 79 y.o.   MRN: [id number]  This is an acute visit.  Level of care skilled.  Facility [facility].  The date is 12/25/2012  Chief complaint-acute visit secondary to dementia with behavior is.  History of present illness.  Patient is a pleasant elderly resident continues to have a gradual decline in his mental status.  He has recently been seen for suspected widespread lymphadenopathy-Dr. Leanord Hawking had an extensive discussion with the sister is power of attorney yesterday and he is on essentially comfort care.--Been extensive discussion the past as well about comfort care with his declining mental status progressing dementia.  Staff has noted some increased agitation during the day and would like a medication for this of note he is on Depakote 125 mg each bedtime as well as Klonopin 0.5 mg each bedtime and Risperdal 0.25 mg Q PM I also spoke with his sister in the facility today she continues to be quite concerned thinking he would benefit from some medication during the day  His vital signs appear to be stable.  Family medical social history has been reviewed her admission note on 12/10/2012.  Medications have been reviewed per [facility] he  Review of systems-somewhat limited secondary to dementia but he is not complaining of any pain shortness of breath at this point appears to be calm he is pleasant confused but cooperative.  Physical exam.  Pulse is 78 respirations of 19.  In general this is a pleasant elderly male in no distress  Skin is warm and dry he continues with adenopathy of prominent left side of his face parotid area astutely noted also has history of posterior neck maxillary area-apparently axilla and inguinal area as well although I did not assess those areas.  His chest is clear to auscultation.  Heart is regular rate and rhythm without murmur gallop or rub.  Psych he is oriented to self did follow simple verbal commands he was  pleasant and cooperative with the exam.  Assessment and plan.  Progressing dementia with anxiety agitation-today he appears to be stable butapparently during the day at times there are periods of agitation and anxiety-will add a when necessary Klonopin 0.25 mg during the day continue with previous medications he mainly gets these at night it appears--this was discussed with his sisterHis POA  [id number]    .

## 2013-01-03 ENCOUNTER — Ambulatory Visit (INDEPENDENT_AMBULATORY_CARE_PROVIDER_SITE_OTHER): Payer: Medicare PPO | Admitting: Orthopedic Surgery

## 2013-01-03 ENCOUNTER — Encounter: Payer: Self-pay | Admitting: Orthopedic Surgery

## 2013-01-03 VITALS — BP 118/57 | Ht 71.0 in | Wt 175.0 lb

## 2013-01-03 DIAGNOSIS — S72009D Fracture of unspecified part of neck of unspecified femur, subsequent encounter for closed fracture with routine healing: Secondary | ICD-10-CM

## 2013-01-03 DIAGNOSIS — S72141D Displaced intertrochanteric fracture of right femur, subsequent encounter for closed fracture with routine healing: Secondary | ICD-10-CM

## 2013-01-03 NOTE — Progress Notes (Signed)
Patient ID: Anthony Leon, Anthony Leon   DOB: 12/26/1918, 79 y.o.   MRN: [id number]  Chief Complaint  Patient presents with  . Follow-up    Hospital follow up right hip DOS 11/28/12    Short gamma nail right hip patient recently diagnosed with lymphoma on comfort care could not get x-rays we'll do later  Hip range of motion is normal  Followup as needed we will get an x-ray at the hospital and I will look at a later and call his sister who is the power of attorney and lateral that I've seen

## 2013-01-03 NOTE — Patient Instructions (Signed)
Will have Xray at [facility]

## 2013-01-04 ENCOUNTER — Ambulatory Visit (HOSPITAL_COMMUNITY): Payer: Medicare PPO | Attending: Orthopedic Surgery

## 2013-01-04 DIAGNOSIS — Z4789 Encounter for other orthopedic aftercare: Secondary | ICD-10-CM | POA: Insufficient documentation

## 2013-01-21 ENCOUNTER — Non-Acute Institutional Stay (SKILLED_NURSING_FACILITY): Payer: Medicare PPO | Admitting: Internal Medicine

## 2013-01-21 DIAGNOSIS — R590 Localized enlarged lymph nodes: Secondary | ICD-10-CM

## 2013-01-21 DIAGNOSIS — R599 Enlarged lymph nodes, unspecified: Secondary | ICD-10-CM

## 2013-01-21 NOTE — Progress Notes (Signed)
Patient ID: Anthony Leon, male   DOB: Jul 12, 1918, 79 y.o.   MRN: [id number] Facility; [facility] Chief complained left-sided neck pain History; as a [place] is a long-standing resident of this facility that I saw last month and ultimately found widespread massive lymphedema of adenopathy involving his cervical occipital, axilla and also inguinal areas. I best guess that this was for some form of lymphoma. I spoke to his sister who is his POA and they were not interested in aggressive medical approach to this including CT scans and all and the biopsies. Therefore we have been managing this expectantly in the facility. He has started to complain mostly at night of left-sided pain. He points to the angle of his jaw. There does not appear to be any oral pain   Physical exam Lymph; massive widespread adenopathy is again observed. He has a palpable area at the angle of his jaw although it don't think this is as parotic glands. There is also notes in the posterior triangle of the left than right. Massive axillary adenopathy is noted Oral; no evidence of anything that could be causing tenderness specifically no evidence of a dental abscess or maxillary sinusitis  Impression/plan #1 lymphoma it is not impossible that one of these areas is becoming somewhat tender. I am going to increase his analgesics. Apparently has had a bad reaction to Ultram in the past. See no additional explanation for this as mentioned I don't think he has parotitis and his oral exam remains completely normal. Suspect this is related to an underlying lymphoma

## 2013-02-05 ENCOUNTER — Non-Acute Institutional Stay (SKILLED_NURSING_FACILITY): Payer: Medicare PPO | Admitting: Internal Medicine

## 2013-02-05 DIAGNOSIS — F0391 Unspecified dementia with behavioral disturbance: Secondary | ICD-10-CM

## 2013-02-05 DIAGNOSIS — R197 Diarrhea, unspecified: Secondary | ICD-10-CM

## 2013-02-05 DIAGNOSIS — C8589 Other specified types of non-Hodgkin lymphoma, extranodal and solid organ sites: Secondary | ICD-10-CM

## 2013-02-05 DIAGNOSIS — I10 Essential (primary) hypertension: Secondary | ICD-10-CM

## 2013-02-05 DIAGNOSIS — S72009S Fracture of unspecified part of neck of unspecified femur, sequela: Secondary | ICD-10-CM

## 2013-02-05 DIAGNOSIS — I6350 Cerebral infarction due to unspecified occlusion or stenosis of unspecified cerebral artery: Secondary | ICD-10-CM

## 2013-02-05 DIAGNOSIS — I4891 Unspecified atrial fibrillation: Secondary | ICD-10-CM

## 2013-02-05 DIAGNOSIS — S72141S Displaced intertrochanteric fracture of right femur, sequela: Secondary | ICD-10-CM

## 2013-02-05 DIAGNOSIS — I639 Cerebral infarction, unspecified: Secondary | ICD-10-CM

## 2013-02-05 DIAGNOSIS — C8590 Non-Hodgkin lymphoma, unspecified, unspecified site: Secondary | ICD-10-CM | POA: Insufficient documentation

## 2013-02-05 NOTE — Progress Notes (Signed)
Patient ID: Anthony Leon, male   DOB: [birth year], 79 y.o.   MRN: [id number] This is an acute visit.  Level of care skilled.  Facility [facility].   Marland Kitchen  Chief complaint-comanagement of dementia hypertension CVA CHF-suspected lymphoma.  .  History of present illness  Patient is a pleasant 79 year old male who has been quite stable in the past but has gradually had a decline in his clinical status. He recently had some increased adenopathy-this appears to be more than likely lymphoma-Dr. Leanord Hawking did have a discussion with his responsible party his sister-family does not want to pursue aggressive interventions here Bernarda Caffey apparently he was having some more discomfort especially in the neck area and Dr. Leanord Hawking did prescribe hydrocodone 7.5 325 every 6 hours when necessary-this appears to be helping.  He also has had some periods of increased agitation with a history of dementia-he has been seen by psychiatric services and is now on Depakote at night as well as Clonopin-also has when necessary Klonopin during the day this apparently is relatively stable although he still occasionally has behaviors  In fact this evening he was somewhat agitated with his sitter and his sister because he stated he could walk and he remembered walking a couple days ago which of course is not the case.  I did spend time speaking with him and telling him it would be very unsafe for him to try to get up and walk he expressed understanding although I suspect this may be a challenge at times  According to his sitter this evening he did have several bouts of diarrhea the past 2 days he is not complaining of any abdominal plain  Earlier this she really had a fairly lengthy hospitalization secondary to a hip fracture-this was complicated with a UTI and also apparently had an episode of uncontrolled heart rate atrial fibrillation-all these issues appear to be relatively stable now however.  His vital signs continued to be stable he is  not complaining of pain this evening-again I did spend some time talking with him about not trying to walk at this time. The.  .    .  Family medical social history as been reviewed per discharge summary on 11/27/2012.--and admission note on 12/10/2012.--As well as numerous previous progress notes.    Medications have been reviewed. Per MAR  .  Review of systems-limited secondary to patient being poor historian--but he denies any pain at this time-also no complaints of shortness of breath chest pain-there is some confusion which is fairly baseline for him  Apparently his had some diarrhea here the past couple days t.  I  Physical exam.   Temperature is 98.7 pulse 74 respirations 20 blood pressure 120/68 this appears to be relatively baseline weight is 162.2. Be relatively stable during  .  In general this is a frail elderly male he appears quite weak but in no distress lying in bed-he appears to recognize me.  His skin is warm and dry. I did appreciate significant adenopathy inguinal area bilaterally as well as the left side of his face-this did not appear to be tender  Eyes pupils appear equal round reactive to light.  Oropharynx appears fairly moist.  Heart is regular irregular rate and rhythm without murmur gallop or rub.  Chest is clear to auscultation with somewhat shallow air entry poor effort.  Abdomen is soft nontender with positive bowel sounds.  Extremities he has arthritic changes i--general frailty he does not really have significant edema  He is able  to move his upper extremities bilaterally significant weakness of his lower extremities.  Neurologic-appears grossly intact his speech is clear do not see any lateralizing findings.  Psych he is oriented to self appear to recognize me-was pleasant and stated to me that he was walking a couple days ago at times-I did spend time trying to convince him that he cannot walk at this time and to do so would put him at risk for  another hip fracture    Labs 12/24/2012.  WBC 6.4 hemoglobin 11.3 platelets 177.  12/14/2012.  Sodium 141 potassium 4.3 BUN 21 creatinine 1.13.  12/12/2012.  Liver function tests within normal limits except alkaline phosphatase 124 albumin of 2.9.  Valproic acid level-14.2.  .  12/02/2012.  WBC 6.3 hemoglobin 10.9 platelets 169.  12/03/2012.  Sodium 135 potassium 3.8 BUN 32 creatinine 1.03.  11/30/2012-TSH 2.26.  Pro [id number].   Assessment and plan.  #1-history of dementia with behaviors-he does continue on Depakote and has Klonopin at night for anxiety-he is followed by psych services-this appears to be progressing--I do note his appetite apparently is good he continues on supplements this is encouraging  #2-history is suspect a lymphoma--again he has been started hydrocodone for pain management-this appears to be effective-family does not wish aggressive intervention Will monitor clinically there are orders for no hospitalization .     #3-history of right hip fracture-this appears stable he is on calcium supplementation  .  #34history of CVA-he does continue on Aggrenox for anticoagulation.  5 CHF -this has not really appeared be an issue at this time--had been on Lasix at one point.  2-D echo showed an ejection fraction of 55%.  #6-apparently some history of atrial fibrillation in the past he is on a beta blocker at this point appears rate controlled apparently there were issues in the hospital.   #7-diarrhea-apparently his head significant amount of his past couple days clinically he appears stable will update CBC metabolic panel tomorrow also  otain C. difficile cultures #8-hypertension this appears stable on Norvasc as well as Lopressor.  #9-BPH-this appears stable he is on Flomax.    [id number]

## 2013-02-12 ENCOUNTER — Non-Acute Institutional Stay (SKILLED_NURSING_FACILITY): Payer: Medicare PPO | Admitting: Internal Medicine

## 2013-02-12 DIAGNOSIS — C8589 Other specified types of non-Hodgkin lymphoma, extranodal and solid organ sites: Secondary | ICD-10-CM

## 2013-02-12 DIAGNOSIS — M542 Cervicalgia: Secondary | ICD-10-CM

## 2013-02-12 DIAGNOSIS — C8590 Non-Hodgkin lymphoma, unspecified, unspecified site: Secondary | ICD-10-CM

## 2013-02-12 NOTE — Progress Notes (Signed)
Patient ID: Anthony Leon, male   DOB: Aug 05, 1918, 79 y.o.   MRN: [id number]  This is an acute visit.  Level of care skilled.  Facility [facility].  Chief complaint-acute visit to followup neck discomfort with history of lymph adenopathy.  History of present illness.  Patient is a pleasant 79 year old male who has been a long term resident of this facility  Fairly recently he developed some significant adenopathy this was in his left parotid area as well as cervical chain and inguinal area.  Dr. Leanord Hawking saw him about a month ago and increased his when necessary pain medication to hydrocodone APAP 7.5 325 mg every 6 hours when necessary.  He also has Tylenol thousand milligrams twice a day routine as well as when necessary dose in the evening.  I had a fairly extensive discussion with his responsible party his sister-as well as with his sitter today-apparently patient is complaining frequently of neck pain and apparently the Tylenol is not totally effective apparently he's been receiving this a lot more than the hydrocodone.  I did review the narcotic book and it appears he does get hydrocodone quite infrequently   Sitter states that Tylenol does not appear to be effective at night when apparently he has more pain complaints.  Currently patient is denying any pain---but according to his family as well as sister. he does complain fairly frequently especially at night  Family medical social history has been reviewed her history and physical on 09/30/2009 previous progress notes including 12/24/2012.  Medications have been reviewed per MAR.  Review of systems somewhat limited secondary to dementia-but he is denying any neck pain chest pain or shortness of breath this evening he appears to be comfortable sitting in his wheelchair.  Physical exam.  He is afebrile pulse of 63 respirations of 18.  General this is a pleasant elderly male in no distress sitting comfortably in his  wheelchair.  Skin is warm and dry.  Neck I do notice significant adenopathy of the left parotid area also cervical chain area which is baseline with previous exams there appears to be getting somewhat larger however certainly compared to initial assessment.  Oropharynx is clear mucous membranes moist.  Heart is regular rate and rhythm with occasional irregular beats.  Chest is clear.  Labs.  02/06/2013.  WBC 5.5 hemoglobin 11.3 platelets 164.  Sodium 140 potassium 3.7 BUN 14 creatinine 1.05.  Assessment and plan.  #1-neck pain issues I suspect is related to lymph adenopathy with suspicions of lymphoma-this has been discussed with his sister his responsible party and there are orders for essentially comfort care with no hospitalization o--no aggressive workup of this--  In regards to pain however apparently he has more discomfort at night instead of the when necessary Tylenol will write order to use the hydrocodone APAP--hopefully this will give additional relief certainly will have to monitor for any sedation or side effects-apparently he did not tolerate tramadol in the past but to my knowledge has not really had a problem with the hydrocodone although again he has been getting this fairly infrequently   Certainly follow closely as needed.  [id number]

## 2013-02-25 ENCOUNTER — Other Ambulatory Visit: Payer: Self-pay | Admitting: *Deleted

## 2013-02-25 MED ORDER — HYDROCODONE-ACETAMINOPHEN 7.5-325 MG PO TABS: 1.0000 | ORAL_TABLET | Freq: Four times a day (QID) | ORAL | Status: DC | PRN

## 2013-02-28 ENCOUNTER — Non-Acute Institutional Stay (SKILLED_NURSING_FACILITY): Payer: Medicare PPO | Admitting: Internal Medicine

## 2013-02-28 DIAGNOSIS — C8590 Non-Hodgkin lymphoma, unspecified, unspecified site: Secondary | ICD-10-CM

## 2013-02-28 DIAGNOSIS — H109 Unspecified conjunctivitis: Secondary | ICD-10-CM

## 2013-02-28 DIAGNOSIS — C8589 Other specified types of non-Hodgkin lymphoma, extranodal and solid organ sites: Secondary | ICD-10-CM

## 2013-02-28 NOTE — Progress Notes (Signed)
Patient ID: Anthony Leon, male   DOB: Mar 08, 1919, 79 y.o.   MRN: [id number]  This is an acute visit.  Level of care skilled.  Facility [facility].  Chief complaint-acute visit followup left eye discomfort.  History of present illness.  Patient is a pleasant elderly resident who apparently a few days ago developed some left eye discomfort-according to his sitter there appeared to be some matting of the eye-- Apparently they've been applying a warm compress several times a day to the area.  When I saw the patient today he was denying any eye discomfort and exam was quite benign appearing.  His sisterhass left a note concerned that possibly the lymphoma which he likely has was contributing to his eye pain  Family medical social history as been reviewed per discharge note on 11/24/2012.  Medications have been reviewed per MAR.  Review of systems.  In general he denies any discomfort.  Eyes-is denying any eye pain today or visual changes.  Respiratoryno complaint shortness of breath or cough.  Cardiac no chest pain.  Muscle skeletal-does complain that time of some neck pain again he does have likely lymphoma especially left side of his neck evident-he's had some pain in the past has been on Tylenol-also has Vicodin when necessary 7.5 325 every 6 hours-I have encouraged use of this if Tylenol is not effective in talking with his sitter this evening apparently he is taking the Vicodin more frequently especially at night and this appears to be helping.  Physical exam.  He is afebrile pulse is 65 respirations of 17.  In general this is a somewhat frail elderly male in no distress sitting comfortably in his wheelchair.  The skin is warm and dry.  Eyes pupils appear equal round reactive to light sclera and conjun ctiva are clear bilaterally I do not see any exudate or erythema-visual acuity appears to be grossly intact. Neck-continue to note some cervical adenopathy and area on his left  parotid area appears to be slightly increased from when I saw it recently--it is somewhat swollen but flesh-colored non-erythematous and not acutely tender to palpation  Chest is clear to auscultation.  Heart is regular rate and rhythm with occasional irregular beats  Labs.  02/06/2013.  WBC 5.5 hemoglobin 11.3 platelets 164.  Sodium 140 potassium 3.7 BUN 14 creatinine 1.05.  Assessment and plan.  #1-conjunctivitis?-This appears to have resolved with treatment warm compresses-and gently cleaning his left eye-exam is quite benign today this appears essentially resolved continue to monitor-in regards to it discomfort I do not really see any relationship to his possible lymphoma but will monitor.  #2-history of adenopathy-likely lymphoma-family does not desire aggressive workup of this essentially desires to keep him comfortable-this appears to be stable with the Tylenol thousand milligrams routinely bid and once  overnight when necessary as well as the Vicodin every 6 hours when necessary-apparently he is taking the Vicodin a bit more frequently and this is helping.  [id number].

## 2013-03-01 ENCOUNTER — Non-Acute Institutional Stay (SKILLED_NURSING_FACILITY): Payer: Medicare PPO | Admitting: Internal Medicine

## 2013-03-01 DIAGNOSIS — R21 Rash and other nonspecific skin eruption: Secondary | ICD-10-CM

## 2013-03-02 ENCOUNTER — Encounter: Payer: Self-pay | Admitting: Internal Medicine

## 2013-03-02 DIAGNOSIS — R21 Rash and other nonspecific skin eruption: Secondary | ICD-10-CM | POA: Insufficient documentation

## 2013-03-02 NOTE — Progress Notes (Signed)
Patient ID: Anthony Leon, male   DOB: [birth year], 79 y.o.   MRN: [id number]  This is an acute visit.  Level of care skilled.  Facility [facility].  Date is 03/01/2013.  Chief complaint-acute visit secondary to erythematous area left thorax.  History of present illness.  Patient is a pleasant elderly resident who actually I saw yesterday for eye issues which appeared to have cleared up.  Today apparently his sitter and sister have noticed an erythematous area around what appears to be a seborrheic keratosis on his left thorax and they would like it assessed.  He does not complaining of any itching or discomfort here or any trauma to the area.  Apparently dermatology did look at this area at some point in the past and diagnosed the lesion as nonpathologic-I suspect they diagnosed it as a seborrheic keratosis.  Physical exam.  Left thorax-there is a seborrheic keratosis-there is also some mild erythema in a somewhat linear pattern on both sides of this-as well as above and below-this does not really appear like cellulitis it appears more like a rash macular papular--.  It is not warm to touch or tender there is no firmness here.  Assessment and plan.  #1-rash-dermatitis unspecified-at this point we'll treat as a rash with a steroid cream-twice a day monitor-- if this does not change or worsens-- certainly we will need to know-I did discuss this with wound care as well.  [id number]

## 2013-03-06 ENCOUNTER — Non-Acute Institutional Stay (SKILLED_NURSING_FACILITY): Payer: Medicare PPO | Admitting: Internal Medicine

## 2013-03-06 DIAGNOSIS — J09X2 Influenza due to identified novel influenza A virus with other respiratory manifestations: Secondary | ICD-10-CM

## 2013-03-10 ENCOUNTER — Ambulatory Visit (HOSPITAL_COMMUNITY): Payer: Medicare PPO | Attending: Internal Medicine

## 2013-03-10 ENCOUNTER — Non-Acute Institutional Stay (SKILLED_NURSING_FACILITY): Payer: Medicare PPO | Admitting: Internal Medicine

## 2013-03-10 DIAGNOSIS — J189 Pneumonia, unspecified organism: Secondary | ICD-10-CM

## 2013-03-10 DIAGNOSIS — R21 Rash and other nonspecific skin eruption: Secondary | ICD-10-CM

## 2013-03-10 DIAGNOSIS — M79609 Pain in unspecified limb: Secondary | ICD-10-CM | POA: Insufficient documentation

## 2013-03-10 DIAGNOSIS — R52 Pain, unspecified: Secondary | ICD-10-CM

## 2013-03-10 DIAGNOSIS — R079 Chest pain, unspecified: Secondary | ICD-10-CM | POA: Insufficient documentation

## 2013-03-10 NOTE — Progress Notes (Signed)
Patient ID: Anthony Leon, male   DOB: 20-Jan-1919, 79 y.o.   MRN: [id number]  This is an acute visit.  Total care skilled.  Facility [facility].  Chief complaint-acute visit secondary to pneumonia-followup influenza.  History of present illness.  Patient is a pleasant elderly male who has had somewhat of a complicated course recently.  He is under essentially comfort care measures for  what appears to be lymphoma family does not desire an aggressive workup here  Apparently last week he developed a significant fever and with flu in the facility he was empirically started on Tamiflu twice a day.  Apparently he recently complained of some left thorax discomfort and a chest x-ray was ordered which did not show any muscle skeletal acute process-however it did show  basilar infiltrates consistent with pneumonia  He also apparently fell yesterday complaining of some right arm pain-x-ray did not show any acute process did show question chronic rotator cuff tear.  As well as degenerative changes.  He has no acute complaints tonight however appears at times he does run a low-grade temperature her O2 saturations have been in the 90s on room air he is a somewhat poor historian secondary to dementia.  His sister also has expressed concerns thinking the Tylenol he gets for pain is not always effective I do note he does have a Vicodin  order apparently this has not used much will write an order to encourage this use.  Family medical social history has been reviewed her history and physical on 09/30/2009.  And 06/27/2011.  Medications have been reviewed per MAR.  Review of systems somewhat limited secondary to dementia.  In general does not complain of fever or chills although appears somewhat weak.  Respiratory does not complain of shortness of breath or cough.  Cardiac no complaints of chest pain does have some history of A. fib.  GI no complaints of abdominal pain according to the sitter his  appetite is not real good however no nausea or vomiting noted.  Muscle skeletal at times will complain of pain again recent fall with no acute fracture dislocation-has complained apparently of some left thorax right arm pain.  Neurologic no complaints of headache or dizziness.  Physical exam.  Temperature is 98.6 pulse 90 respirations 22 blood pressure 125/48.  In general this is a pleasant elderly male who appears somewhat weak but in no distress sitting in his wheelchair.  His skin is warm and dry--- he did have a rash on his left thorax this appears to have resolved with topical cream.  Chest poor respiratory effort but no labored breathing I cannot really appreciate significant congestion  Abdomen soft nontender positive bowel sounds.  Muscle skeletal ambulates in a wheelchair I did not note any deformities of his extremities there is some bruising.  Baseline lower extremity edema appears fairly mild.  Neurologic is grossly intact his speech is clear appears tired but alert.  Labs.  Nov--- 26 2014.  WBC 5.5 hemoglobin 11.3 platelets 164.  Sodium 140 potassium 3.7 BUN 14 creatinine 1.05   Assessment and plan.  #1-pneumonia-he does have numerous drug allergies which complicates matters a bit-I did discuss this with his responsible party his sister via phone.  Apparently he does have some anaphylactic reaction with penicillin although appears he's been on cephalosporins at times in the past.  At this point will start him on Bactrim DS twice a day for 10 days and monitor-clinically he does not appear to be unstable certainly but quite  weak.  Also will start probiotic twice a day x10 days.--As well as Mucinex 600 twice a day for 7 days  #2-possible influenza-he continues on Tamiflu-still has a low-grade temperature at times but O2 saturations appear to be satisfactory no labored breathing.  #3-thorax rash-this appears resolved.  #4-history of fall-x-ray results were  negative for any acute process-he appears to be fairly comfortable although apparently still does have pain Tylenol is not completely effective according to his sister--he does have a when necessary Vicodin order will write an order to encourage this use more frequently as needed.   [id number] .

## 2013-03-18 NOTE — Progress Notes (Signed)
Patient ID: Anthony Leon, male   DOB: 07-22-1918, 80 y.o.   MRN: [id number]           PROGRESS NOTE  DATE:  03/06/2013    FACILITY: [facility]    LEVEL OF CARE:   SNF   Acute Visit   CHIEF COMPLAINT:  Cough, listless, increasing confusion.    HISTORY OF PRESENT ILLNESS:  I have been asked to look at this man, who is a longstanding resident of the facility.  He has background dementia and also probably a smoldering lymphoma.  We have previously elected not to pursue the accurate diagnosis of his widespread lymphadenopathy.    He has been noted by the attendant, who sits with him during the day, to have a harsh cough.  He is much more listless and she reports increasing confusion, although he is not febrile.       PHYSICAL EXAMINATION:   VITAL SIGNS:   TEMPERATURE: 98.1.   PULSE:  92.   RESPIRATIONS:  18.   O2 SATURATIONS:  92% on room air.   CHEST/RESPIRATORY:  A few crackles in the left lower lobe.   CARDIOVASCULAR:  CARDIAC:   Heart sounds are normal.  There are no murmurs.  He appears to be euvolemic.   EDEMA/VARICOSITIES:  Extremities:  No edema.    ASSESSMENT/PLAN:  Cough with systemic manifestations in the setting of influenza A.  I am going to put him on Tamiflu.  He has a few crackles in the left lower lobe.  However, I am not going to try and x-ray him at this point.  We will follow him clinically.     CPT CODE: 94854

## 2013-03-22 ENCOUNTER — Other Ambulatory Visit: Payer: Self-pay | Admitting: *Deleted

## 2013-03-22 MED ORDER — HYDROCODONE-ACETAMINOPHEN 7.5-325 MG PO TABS: ORAL_TABLET | ORAL | Status: DC

## 2013-03-22 NOTE — Telephone Encounter (Signed)
rx filled per protocol  

## 2013-04-04 ENCOUNTER — Other Ambulatory Visit: Payer: Self-pay | Admitting: *Deleted

## 2013-04-04 MED ORDER — HYDROCODONE-ACETAMINOPHEN 7.5-325 MG PO TABS: ORAL_TABLET | ORAL | Status: DC

## 2013-04-09 ENCOUNTER — Other Ambulatory Visit: Payer: Self-pay | Admitting: *Deleted

## 2013-04-09 MED ORDER — HYDROCODONE-ACETAMINOPHEN 7.5-325 MG PO TABS: ORAL_TABLET | ORAL | Status: AC

## 2013-04-11 ENCOUNTER — Other Ambulatory Visit: Payer: Self-pay | Admitting: *Deleted

## 2013-04-11 MED ORDER — OXYCODONE HCL 20 MG/ML PO CONC: ORAL | Status: AC

## 2013-04-17 ENCOUNTER — Non-Acute Institutional Stay (SKILLED_NURSING_FACILITY): Payer: Medicare Other | Admitting: Internal Medicine

## 2013-04-17 DIAGNOSIS — B370 Candidal stomatitis: Secondary | ICD-10-CM | POA: Insufficient documentation

## 2013-04-17 NOTE — Progress Notes (Signed)
Patient ID: Anthony Leon, male   DOB: 08/07/1918, 80 y.o.   MRN: [id number]   This is an acute visit.  Level care skilled.  Facility [facility].  Chief complaint-acute visit secondary to film on tongue.  History of present illness.  Patient is a pleasant elderly resident now essentially under comfort care and hospice services secondary to suspected lymphoma and failure to thrive-family does not wish aggressive workup of this.  Apparently nursing staff has noted a film on his tongue and I been asked to assess this.  Patient is not really complaining of sore throat although his family feels he is having some discomfort with this-.  His appetite  is fairly poor this has been somewhat of a chronic situation here recently.  Vital signs continue to be stable.  Family medical social history as been reviewed per readmission note on 12/10/2012.  Medications have been reviewed per MAR.  Review of systems.  Somewhat limited secondary to dementia.  The patient is not complaining overtly of sore throat any shortness of breath or chest pain or abdominal pain for that matter.  Physical exam.  Temperature is 98.9 pulse 89 respirations 18 blood pressure 103/68.  In general this is a frail-appearing elderly male in no distress lying in bed.  His skin is warm and dry did not really note any rashes.  Oropharynx he does have somewhat of a yellow brownish coating on his tongue mucous membranes appear fairly moist.  His chest is clear to auscultation with somewhat poor respiratory effort no labored breathing.  Heart is regular rate and rhythm without murmur gallop or rub.  Abdomen is soft nontender positive bowel sounds.  Labs.  02/06/2013.  CBC 5.5 hemoglobin 11.3 platelets 164.  Sodium 140 potassium 3.7 BUN 14 creatinine 1.05.   assessment and plan.  #1-suspected thrush-will treat with Diflucan 100 mg daily x1 day-also nystatin 5 cc will have to swab 4 times a day x5 days if no  improvement certainly notify provider--this plan was discussed with his responsible party his sister at bedside   he appears stable again he continues under essentially comfort measures he does not appear to be uncomfortable--.  [id number]

## 2013-05-05 ENCOUNTER — Non-Acute Institutional Stay (SKILLED_NURSING_FACILITY): Payer: Medicare Other | Admitting: Internal Medicine

## 2013-05-05 DIAGNOSIS — T148XXA Other injury of unspecified body region, initial encounter: Secondary | ICD-10-CM

## 2013-05-05 NOTE — Progress Notes (Signed)
Patient ID: Anthony Leon, male   DOB: 07/13/1918, 80 y.o.   MRN: [id number] This is an acute visit.  Level of care skilled.  Facility [facility].  Chief complaint acute visit secondary to mild discomfort.  History of present oh.  Patient is a pleasant elderly resident who has been a long-term resident of the facility that she is essentially under comfort care secondary to concerns he has a progressing lymphoma with no aggressive intervention desired by family.  He has been complaining apparently at times of some mild pain-his diet was recently reduced to mechanical soft regular this appears to be helping.  On exam tonight I did note a small blister the left side of his cheek as well.  According to the sitter tonight he ate fairly well did not really complain of acute mild discomfort.  Physical exam.  Is a pleasant elderly male in no distress.  His skin is warm and dry.  Mouth I do note left cheek area there is a small white blister middle of left cheek there is no surrounding erythema and it does not appear really very tender  Otherwise oropharynx appears clear I do not note any other lesions blisters.  Assessment and plan.  Mild blister-this appears fairly minimal but could lead to some discomfort I can see at times-will order magic mouthwash 4 times a day 5 cc when necessary x1 week and monitor closely   hopefully his diet downgrade will help as well with any mouth discomfort  [id number]

## 2013-05-21 ENCOUNTER — Non-Acute Institutional Stay: Payer: Medicare Other | Admitting: Internal Medicine

## 2013-05-21 DIAGNOSIS — C8590 Non-Hodgkin lymphoma, unspecified, unspecified site: Secondary | ICD-10-CM

## 2013-05-21 DIAGNOSIS — I10 Essential (primary) hypertension: Secondary | ICD-10-CM

## 2013-05-21 DIAGNOSIS — I6350 Cerebral infarction due to unspecified occlusion or stenosis of unspecified cerebral artery: Secondary | ICD-10-CM

## 2013-05-21 DIAGNOSIS — F0391 Unspecified dementia with behavioral disturbance: Secondary | ICD-10-CM

## 2013-05-21 DIAGNOSIS — F03918 Unspecified dementia, unspecified severity, with other behavioral disturbance: Secondary | ICD-10-CM

## 2013-05-21 DIAGNOSIS — K5900 Constipation, unspecified: Secondary | ICD-10-CM | POA: Insufficient documentation

## 2013-05-21 DIAGNOSIS — I639 Cerebral infarction, unspecified: Secondary | ICD-10-CM

## 2013-05-21 DIAGNOSIS — C8589 Other specified types of non-Hodgkin lymphoma, extranodal and solid organ sites: Secondary | ICD-10-CM

## 2013-05-21 NOTE — Progress Notes (Signed)
Patient ID: Anthony Leon, male   DOB: Apr 28, 1918, 80 y.o.   MRN: [id number]    This is an acute visit.  Level of care skilled.  Facility [facility].   Chief complaint acute visit secondary to constipation-management of dementia hypertension CVA CHF-suspected lymphoma.  .  History of present illness  Patient is a pleasant 80 year old male who has been quite stable in the past but has gradually had a decline in his clinical status.  He recently had some increased adenopathy-this appears to be more than likely lymphoma-Dr. Dellia Nims did have a discussion with his responsible party his sister-family does not want to pursue aggressive interventions here--actually is now under hospice care  Susy Frizzle apparently he was having some more discomfort especially in the neck area-he is receiving OxyFast 5 mg every 4 hours when necessary which appears to be helping .  He also has had some periods of increased agitation with a history of dementia-he has been seen by psychiatric services and is now on Depakote at night as well as Clonopin-also has when necessary Klonopin during the day this apparently is relatively stable although he still occasionally has behaviors  I   Last year had a fairly lengthy hospitalization secondary to a hip fracture-this was complicated with a UTI and also apparently had an episode of uncontrolled heart rate atrial fibrillation-all these issues appear to be relatively stable now however.  His vital signs continued to be stable he is not complaining of pain   However he is receiving a narcotic and this appears to have led to some constipation-he is on several agents--including Colace and Senokot which was just increased to 2 tabs 3 times a day on March 5-however according to his sister and nursing staff there are still constipation issues at times.  .  .  Family medical social history as been reviewed per discharge summary on Dec 10, 2012.--and admission note on 12/10/2012.--As well as numerous  previous progress notes .  Medications have been reviewed. Per MAR  .  Review of systems-limited secondary to patient being poor historian--but he denies any pain at this time-also no complaints of shortness of breath chest pain-there is some confusion which is fairly baseline for him  Apparently his had some constipation but he is not complaining of any abdominal discomfort  t.  I  Physical exam.   Temperature 97.5 pulse 72 respirations 18 blood pressure variable 91/51-132/70 in this range I do not see consistent lows however weight is 149 this appears to be a loss of about 15 pounds since late last year  .  In general this is a frail elderly male he appears quite weak having in his wheelchair-he appears to recognize me.  His skin is warm and dry. --Continues with some adenopathy left side neck   Eyes pupils appear equal round reactive to light.  Oropharynx appears fairly moist.  Heart is regular irregular rate and rhythm without murmur gallop or rub.  Chest is clear to auscultation with somewhat shallow air entry poor effort.  Abdomen is soft nontender with positive bowel sounds--.  Extremities he has arthritic changes i--general frailty he does not really have significant edema  He is able to move his upper extremities bilaterally significant weakness of his lower extremities.  Neurologic-appears grossly intact his speech is clear do not see any lateralizing findings.  Psych he is oriented to self appear to recognize me-was pleasant   Labs   #26 2014.  WBC 5.5 hemoglobin 11.3 platelets 164.  Sodium 140 potassium  3.7 BUN 14 creatinine 1.05.   12/24/2012.  WBC 6.4 hemoglobin 11.3 platelets 177.  12/14/2012.  Sodium 141 potassium 4.3 BUN 21 creatinine 1.13.  12/12/2012.  Liver function tests within normal limits except alkaline phosphatase 124 albumin of 2.9.  Valproic acid level-14.2.  .  12/02/2012.  WBC 6.3 hemoglobin 10.9 platelets 169.  12/03/2012.  Sodium 135  potassium 3.8 BUN 32 creatinine 1.03.  11/30/2012-TSH 2.26.  Pro [id number].   Assessment and plan.  #1-history of dementia with behaviors-he does continue on Depakote and has Klonopin at night for anxiety-he is followed by psych services-this appears to be progressing--certainly complicated with his other issues including suspected lymphoma  #2-history is suspect a lymphoma-suspect this is slowly progressing-family does not wish aggressive intervention Will monitor clinically there are orders for no hospitalization--at this point pain appears to be controlled  .  #3-history of right hip fracture-this appears stable he is on calcium supplementation  .  #34history of CVA-he does continue on Aggrenox for anticoagulation.  5 CHF -this has not really appeared be an issue at this time--had been on Lasix at one point.  2-D echo showed an ejection fraction of 55%.  #6-apparently some history of atrial fibrillation in the past he is on a beta blocker at this point appears rate controlled .  #7-obstipation-he continues on Colace as well as the Senokot twice a day-will order Ducolaxc suppository daily if no results certainly notify provider-he has had some diarrhea in the past although this has not been a problem recently  #8-hypertension this appears stable on Norvasc as well as Lopressor.--There are orders to hold fo rlow systolic #[id number] appears stable he is on Flomax.  [id number]

## 2013-06-12 ENCOUNTER — Non-Acute Institutional Stay (SKILLED_NURSING_FACILITY): Payer: Medicare Other | Admitting: Internal Medicine

## 2013-06-12 DIAGNOSIS — F0391 Unspecified dementia with behavioral disturbance: Secondary | ICD-10-CM

## 2013-06-12 DIAGNOSIS — F03918 Unspecified dementia, unspecified severity, with other behavioral disturbance: Secondary | ICD-10-CM

## 2013-06-12 NOTE — Progress Notes (Signed)
Patient ID: Anthony Leon, male   DOB: 1919/01/03, 80 y.o.   MRN: [id number]   This is an acute visit.  Level of care skilled.  Facility [facility].  Chief complaint-acute visit followup dementia with behaviors.  History of present illness.  Patient is a pleasant elderly resident essentially under comfort care with a history of suspected progressing lymphoma-.  He has been a long-term resident here has been relatively stable although appears to have some more increased dementia with behavior episodes at times especially at night where he is anxious and irritated apparently at times in the middle of the night.  He does have an order for Klonopin 0.5 mg each bedtime routine and actually 0.25 mg when necessary which apparently helps him overnight although it is unclear howoften he really gets this  He is also on Depakote  At bedtime. He is followed by psych services.  His sister his responsible party is concerned about the nighttime agitation and I had a fairly extensive discussion with her earlier today.  Today patient appears to be at his baseline he is pleasant restingcomfortably says nothing is bothering him--although apparently does have outbursts in the middle of the night when he wakes up at times.  Family medical social history has been reviewed.  Medications have been reviewed per MAR.  Review of systems.  Somewhat limited secondary to patient being a poor historian but he is not complaining of any shortness of breath chest pain muscle skeletal pain or anything at this point bothering him.  Physical exam.  He is afebrile pulse is 68 respirations of 21.  In general this is a frail elderly male in no distress resting comfortably in bed.  His skin is warm and dry. I do note what appears to be a growing nodular area most prominently left jaw area again this appears to be progressing lymphoma per previous evaluations  Oropharynx is clear mucous membranes moist  Chest is clear to  auscultation. No labored breathing.  Heart is regular or irregular rate and rhythm without murmur gallop or rub he has minimal lower extremity edema.  Abdomen soft nontender with positive bowel sounds.  Assessment and plan.  #1-dementia with agitation and behaviors-somewhat challenging since often when I see patient he is calm and cooperative and quite friendly-however at night apparently there are issues at times-I have spoken with nursing staff about the importance of getting the when necessary Klonopin my understanding is this does helpsome-however this will have to be followed closely- also will have psychiatric services to followup on this I suspect this will continue to be a challenge  [id number]

## 2013-06-18 ENCOUNTER — Non-Acute Institutional Stay (SKILLED_NURSING_FACILITY): Payer: Medicare Other | Admitting: Internal Medicine

## 2013-06-18 DIAGNOSIS — R1310 Dysphagia, unspecified: Secondary | ICD-10-CM

## 2013-06-18 DIAGNOSIS — R627 Adult failure to thrive: Secondary | ICD-10-CM

## 2013-06-23 NOTE — Progress Notes (Signed)
Patient ID: Anthony Leon, male   DOB: 09-22-1918, 80 y.o.   MRN: [id number]   This is an acute visit.  Level of care skilled.  Facility [facility].  Date is 06/18/2013.  Chief complaint-acute visit secondary to patient not swallowing all his food.  History of present illness.  Patient is a pleasant 80 year old male who has been a long-term resident here.  He is gradually declining --with a history of suspected lymphoma that family does not want aggressively pursued essentially want comfort care.  His sister and sitter have noticed that he is not really swallowing much tonight and have asked me to take a look at it-he is followed by hospice services  Family medical social history as been reviewed admission note on 12/10/2012.  Medications have been reviewed per MAR.  Review of systems-limited secondary to dementia the patient is not complaining of any sore throat or pain at this time no chest pain or shortness of breath according to patient-he does not complaining of any throat pain or tightening.  Physical exam.  He is afebrile pulse is 72 respirations of 18.  In general this is a frail elderly male lying in bed comfortably.  His skin is warm and dry the raised area left mandibular area is quite prominent this is not new and I suspect lymphoma.  His oropharynx is clear mucous membranes are fairly moist.  Chest is clear to auscultation with somewhat poor respiratory effort no labored breathing.  Heart is regular rate and rhythm with occasional irregular beats he has some mild lower extremity edema which appears to be baseline.  Neurologic is grossly intact he is alert he is talking appears somewhat weak but not grossly changed from baseline.  Assessment and plan  Dysphasia?.  I did have a fairly extensive discussion with his responsible party a sister at bedside.  Patient is gradually declining and followed by hospice-family does not want any aggressive intervention I  suspect this may be progression of his dementia and general medical condition-I did tell them that as this progresses patient may lose appetite and willingness or ability to swallow-family expressed understanding at this point we'll continue to monitor--- he does not appear to be uncomfortable certainly this evening--and this may just be an episode and patient may respond by eating and swallowing in the morning this could be more of a function of the lateness of the hour --shortly before  midnight.  Again nursing staff will monitor notify provider of any changes.  Hospice also will be contacted in the morning-at this point I do not see an acute change or process  [phone]  .  Marland Kitchen

## 2013-07-03 ENCOUNTER — Other Ambulatory Visit: Payer: Self-pay | Admitting: *Deleted

## 2013-07-03 MED ORDER — CLONAZEPAM 0.5 MG PO TABS: ORAL_TABLET | ORAL | Status: AC

## 2013-07-03 NOTE — Telephone Encounter (Signed)
[facility] 

## 2013-07-16 ENCOUNTER — Encounter: Payer: Self-pay | Admitting: Internal Medicine

## 2013-07-16 ENCOUNTER — Non-Acute Institutional Stay (SKILLED_NURSING_FACILITY): Payer: Medicare Other | Admitting: Internal Medicine

## 2013-07-16 DIAGNOSIS — C8590 Non-Hodgkin lymphoma, unspecified, unspecified site: Secondary | ICD-10-CM

## 2013-07-16 DIAGNOSIS — R0989 Other specified symptoms and signs involving the circulatory and respiratory systems: Secondary | ICD-10-CM | POA: Insufficient documentation

## 2013-07-16 DIAGNOSIS — F0390 Unspecified dementia without behavioral disturbance: Secondary | ICD-10-CM

## 2013-07-16 DIAGNOSIS — C8589 Other specified types of non-Hodgkin lymphoma, extranodal and solid organ sites: Secondary | ICD-10-CM

## 2013-07-16 DIAGNOSIS — I5033 Acute on chronic diastolic (congestive) heart failure: Secondary | ICD-10-CM

## 2013-07-16 DIAGNOSIS — I639 Cerebral infarction, unspecified: Secondary | ICD-10-CM

## 2013-07-16 DIAGNOSIS — I10 Essential (primary) hypertension: Secondary | ICD-10-CM

## 2013-07-16 DIAGNOSIS — I6350 Cerebral infarction due to unspecified occlusion or stenosis of unspecified cerebral artery: Secondary | ICD-10-CM

## 2013-07-16 NOTE — Progress Notes (Signed)
Patient ID: LOCHLANN MASTRANGELO, male   DOB: [birth year], 80 y.o.   MRN: [id number]   This is an acute--routine visit.  Level of care skilled.  Facility [facility].   Chief complaint -management of dementia hypertension CVA CHF-suspected lymphoma. --Acute visit secondary to? Chest congestion .  History of present illness  Patient is a pleasant 80year-old male who had been quite stable in the past but has gradually had a decline in his clinical status.  He has had some increased adenopathy-this appears to be more than likely lymphoma-Dr. Dellia Nims did have a discussion with his responsible party his sister-family does not want to pursue aggressive interventions here--actually is now under hospice care     .  He also has had some periods of increased agitation with a history of dementia-he has been seen by psychiatric services and is now on Depakote at night as well as Klonopin-also has when necessary Klonopin during the day this apparently is relatively stable although he still occasionally has behaviors  I  Last year had a fairly lengthy hospitalization secondary to a hip fracture-this was complicated with a UTI and also apparently had an episode of uncontrolled heart rate atrial fibrillation-all these issues appear to be relatively stable now however.  His vital signs continued to be stable he is not complaining of pain   He is receiving OxyFast apparently with good relief although apparently he complains of pain at times however he denies any pain when I saw him in the room today.  Apparently family and sitter noticed some chest congestion at times-this appears to be intermittent according to his sister and his sitter-he does not complaining of any shortness of breath or cough past family does not want aggressive interventions like an x-ray--he has when necessary nebulizers as well as Mucinex when necessary--O2 sats have been in the 90s.  .  .  Family medical social history as been reviewed per discharge  summary on 11/24/2012.--and admission note on 12/10/2012.--As well as numerous previous progress notes  .  Medications have been reviewed. Per MAR  .  Review of systems-limited secondary to patient being poor historian--but he denies any pain at this time-also no complaints of shortness of breath chest pain-there is some confusion which is fairly baseline for him   t.  I  Physical exam.   Is afebrile pulse 80 respirations 19 blood pressure 111/68-103/66-O2 saturations in the 90s on room air.    .  In general this is a frail elderly male in no distress but appears quite weak lying in bed-he appears to recognize me.  His skin is warm and dry. --Continues with some adenopathy left side neck  Eyes pupils appear equal round reactive to light.  Oropharynx appears fairly moist.  Heart is regular irregular rate and rhythm without murmur gallop or rub.  Chest is clear to auscultation with somewhat shallow air entry poor effort.  Abdomen is soft nontender with positive bowel sounds--.  Extremities he has arthritic changes i--general frailty he does not really have significant edema  He is able to move his upper extremities bilaterally significant weakness of his lower extremities.  Neurologic-appears grossly intact his speech is clear do not see any lateralizing findings.  Psych he is oriented to self appear to recognize me-was pleasant--this is baseline when I had seen him in the past   Labs   No recent labs most recent labs late last year.  WBC 5.5 hemoglobin 11.3 platelets 164.  Sodium 140 potassium 3.7 BUN 14  creatinine 1.05.   12/24/2012.  WBC 6.4 hemoglobin 11.3 platelets 177.  12/14/2012.  Sodium 141 potassium 4.3 BUN 21 creatinine 1.13.  12/12/2012.  Liver function tests within normal limits except alkaline phosphatase 124 albumin of 2.9.  Valproic acid level-14.2.  .  12/02/2012.  WBC 6.3 hemoglobin 10.9 platelets 169.  12/03/2012.  Sodium 135 potassium 3.8 BUN 32 creatinine  1.03.  11/30/2012-TSH 2.26.  Pro [id number].   Assessment and plan.  #1-history of dementia with behaviors-he does continue on Depakote and has Klonopin at night for anxiety-he is followed by psych services-this appears to be progressing--certainly complicated with his other issues including suspected lymphoma--he continues to decline although does not appear to be precipitously so a  #2-history is suspect a lymphoma-suspect this is slowly progressing-family does not wish aggressive intervention Will monitor clinically there are orders for no hospitalization--at this point pain appears to be controlled  .  #3-history of right hip fracture-this appears stable he is on calcium supplementation  .  #34history of CVA-he does continue on Aggrenox for anticoagulation.  5 CHF -this has not really appeared be an issue at this time--had been on Lasix at one point.  2-D echo showed an ejection fraction of 55%.  #6-apparently some history of atrial fibrillation in the past he is on a beta blocker at this point appears rate controlled .     #7-hypertension this appears stable on Norvasc as well as Lopressor.--There are orders to hold fo rlow systolic  #9-OBS-JGGE appears stable he is on Flomax.   #9-chest congestion-again family does not desire aggressive intervention with the chest x-ray-continue to monitor he does have when necessary Mucinex and nebulizers O2 saturation is 95% today on room air    [phone]

## 2013-07-20 ENCOUNTER — Encounter: Payer: Self-pay | Admitting: Internal Medicine

## 2013-07-20 ENCOUNTER — Non-Acute Institutional Stay (SKILLED_NURSING_FACILITY): Payer: Medicare Other | Admitting: Internal Medicine

## 2013-07-20 DIAGNOSIS — R1319 Other dysphagia: Secondary | ICD-10-CM

## 2013-07-20 DIAGNOSIS — R627 Adult failure to thrive: Secondary | ICD-10-CM

## 2013-07-20 DIAGNOSIS — C8589 Other specified types of non-Hodgkin lymphoma, extranodal and solid organ sites: Secondary | ICD-10-CM

## 2013-07-20 DIAGNOSIS — R000 Tachycardia, unspecified: Secondary | ICD-10-CM

## 2013-07-20 DIAGNOSIS — C8590 Non-Hodgkin lymphoma, unspecified, unspecified site: Secondary | ICD-10-CM
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
## 2013-07-23 ENCOUNTER — Other Ambulatory Visit: Payer: Self-pay | Admitting: *Deleted

## 2013-07-23 MED ORDER — CLONAZEPAM 0.5 MG PO TABS: ORAL_TABLET | ORAL | Status: AC

## 2013-07-23 NOTE — Telephone Encounter (Signed)
[facility] 

## 2013-07-25 NOTE — Progress Notes (Signed)
This encounter was created in error - please disregard.

## 2013-10-01 ENCOUNTER — Non-Acute Institutional Stay (SKILLED_NURSING_FACILITY): Payer: Medicare Other | Admitting: Internal Medicine

## 2013-10-01 DIAGNOSIS — C8589 Other specified types of non-Hodgkin lymphoma, extranodal and solid organ sites: Secondary | ICD-10-CM

## 2013-10-01 DIAGNOSIS — C8590 Non-Hodgkin lymphoma, unspecified, unspecified site: Secondary | ICD-10-CM

## 2013-10-01 DIAGNOSIS — R627 Adult failure to thrive: Secondary | ICD-10-CM

## 2013-10-01 DIAGNOSIS — N400 Enlarged prostate without lower urinary tract symptoms: Secondary | ICD-10-CM

## 2013-10-01 DIAGNOSIS — F0391 Unspecified dementia with behavioral disturbance: Secondary | ICD-10-CM

## 2013-10-01 DIAGNOSIS — I4891 Unspecified atrial fibrillation: Secondary | ICD-10-CM

## 2013-10-01 DIAGNOSIS — F03918 Unspecified dementia, unspecified severity, with other behavioral disturbance: Secondary | ICD-10-CM

## 2013-10-01 DIAGNOSIS — R000 Tachycardia, unspecified: Secondary | ICD-10-CM

## 2013-10-01 DIAGNOSIS — I959 Hypotension, unspecified: Secondary | ICD-10-CM
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
## 2013-10-02 ENCOUNTER — Encounter: Payer: Self-pay | Admitting: Internal Medicine

## 2013-10-31 ENCOUNTER — Non-Acute Institutional Stay (SKILLED_NURSING_FACILITY): Payer: Medicare Other | Admitting: Internal Medicine

## 2013-10-31 DIAGNOSIS — R627 Adult failure to thrive: Secondary | ICD-10-CM

## 2013-10-31 NOTE — Progress Notes (Signed)
Patient ID: Anthony Leon, male   DOB: 1918-05-28, 80 y.o.   MRN: [id number]   This is an acute visit.  Level care skilled.  Facility [facility].  Chief complaint-acute visit followup failure to thrive-end-of-life issues.  History of present illness.  Patient is a pleasant 80 year old male who has been a long-term resident of this facility-he has gradually been declining-this is complicated by what is suspected to be a lymphoma the family did not want to pursue any aggressive medical intervention.  He also has underlying dementia as well as atrial fibrillation CHF-.  Patient appears to be the end stages of life-and is not really speaking anymore-nursing staff states he is comfortable he is receiving Roxanol sublingual as well as Ativan as needed for anxiety.  He actually has medications that are supposed to be administered and nursing staff would like these discontinued which is certainly warranted.  -Family medical social history as been reviewed per previous progress notes.  Medications have been reviewed per [facility] I do note he is on Lopressor for blood pressure and heart rate control-on Depakote for behaviors again I don't see any reason for him to be taking these and I  do not believe he has taken them recently secondary to inability to follow any commands  Review of systems-as stage GI is not really speaking but nursing staff feels he is not having any unrelieved pain or respiratory distress he does have a sitter in the room as well.  Physical exam.  He is afebrile pulse of 93 respirations are irregular at about 12-16.  In general this is a very frail elderly male in no distress he does open his eyes but does not appear to be really responsive.  Mucous membranes appear dry his mouth is open.  Chest shallow air entry I could not really hear any congestion no labored breathing  Heart is regular rate and rhythm with occasional irregular beats he has no edema.  Abdomen is soft not  distended there are bowel sounds he does not appear to be tender.  Muscle skeletal has extreme frailty in all extremities very frail appearing  Neurologic difficult exam since patient is not really responding.  Assessment and plan.  #1-failure to thrive-end-of-life issues-have spoken with nursing staff about discontinuing all medications except the essential medications which is his pain  med-Roxanol-as well as anxiety Ativan medications At this point he appears comfortable I expect his prognosis is probably less than 48 hours although certainly difficult to tell-again the emphasis is on comfort and he does not appear to be in any distress  [id number]

## 2013-11-03 DIAGNOSIS — R627 Adult failure to thrive: Secondary | ICD-10-CM | POA: Insufficient documentation

## 2013-11-12 DEATH — deceased

## 2014-07-09 IMAGING — CR DG CHEST 2V
2 series · 2 of 2 positions shown · non-contrast
Comparison: 11/30/2012

CLINICAL DATA: Cough, congestion, history hypertension, CHF, stroke

EXAM:
CHEST  2 VIEW

[view not recorded (1 of 2)]
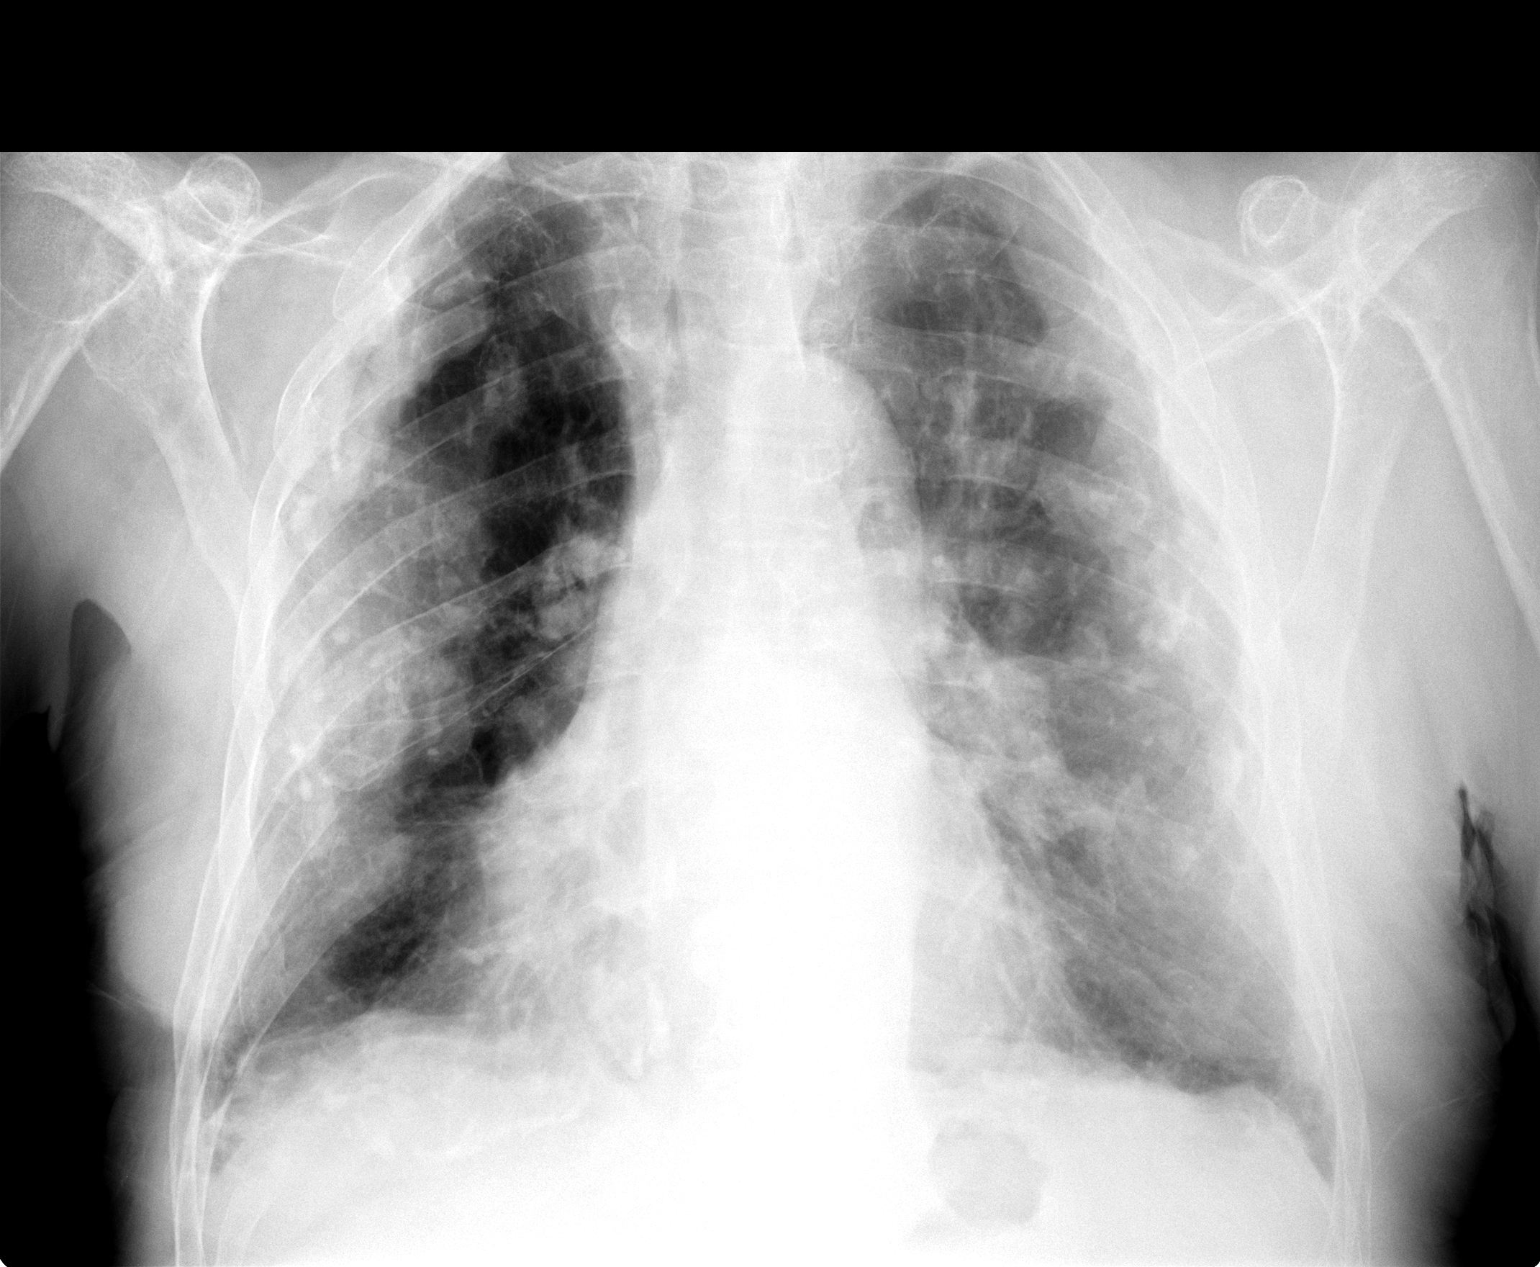

[view not recorded (2 of 2)]
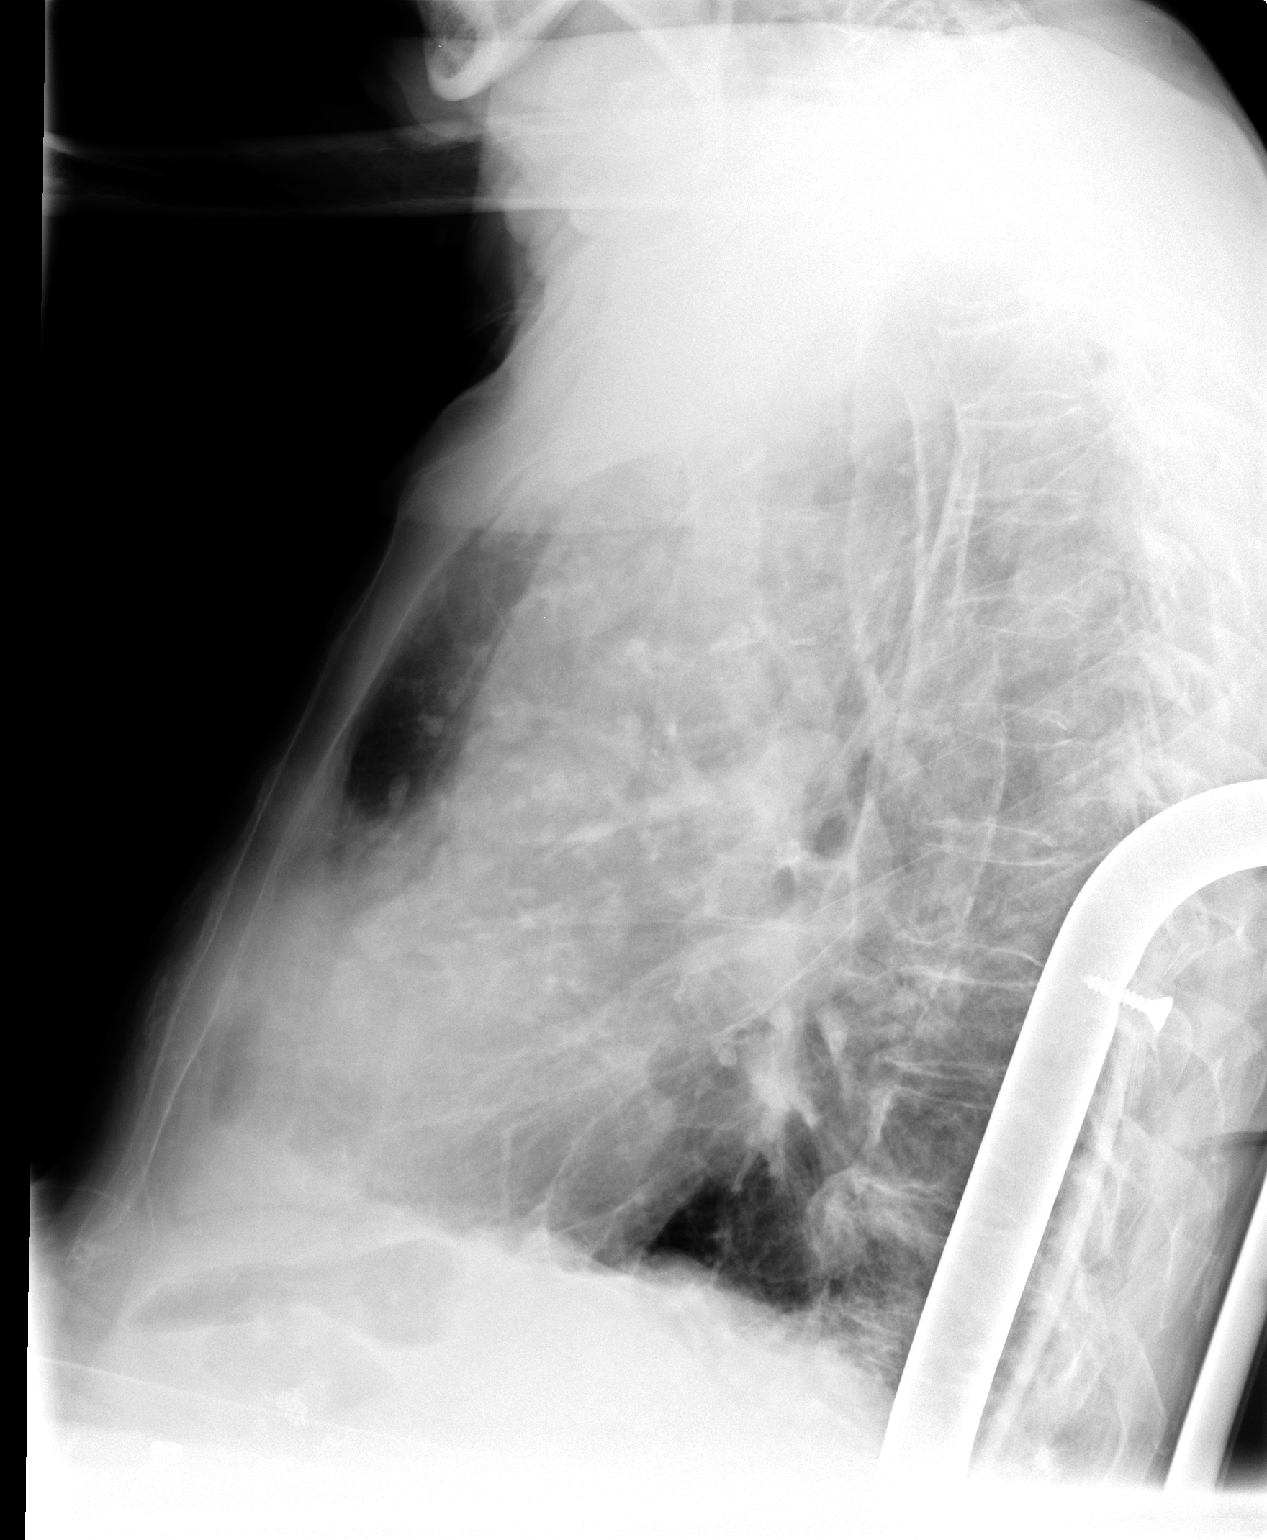

[2 of 2 positions shown; findings below may reference images not displayed]

FINDINGS: Slightly rotated to the right.

Borderline enlargement of cardiac silhouette.

Atherosclerotic calcification aorta.

Pulmonary vascularity normal.

Extensive calcified pleural plaque disease throughout the
hemithoraces bilaterally, appears more pronounced than on the right
versus previous exam due to PA versus AP technique; appearance is
more similar to that on earlier study of 08/24/2012.

Underlying emphysematous changes.

Right basilar atelectasis versus scarring.

No definite acute infiltrate, pleural effusion, or pneumothorax.

Bones diffusely demineralized.

The
IMPRESSION: Extensive calcified pleural plaque disease throughout the
hemithoraces bilaterally compatible with asbestos exposure.

Underlying COPD changes with right basilar atelectasis versus
scarring.
